# Patient Record
Sex: Male | Born: 1965 | Race: White | Hispanic: No | Marital: Single | Smoking: Never smoker
Health system: Southern US, Community
[De-identification: ages and names within clinical notes are randomized; demographics above are authoritative.]

## PROBLEM LIST (undated history)

## (undated) DIAGNOSIS — R51 Headache: Secondary | ICD-10-CM

## (undated) DIAGNOSIS — R42 Dizziness and giddiness: Secondary | ICD-10-CM

## (undated) DIAGNOSIS — D509 Iron deficiency anemia, unspecified: Secondary | ICD-10-CM

## (undated) DIAGNOSIS — B019 Varicella without complication: Secondary | ICD-10-CM

## (undated) DIAGNOSIS — K649 Unspecified hemorrhoids: Secondary | ICD-10-CM

## (undated) DIAGNOSIS — K635 Polyp of colon: Secondary | ICD-10-CM

## (undated) HISTORY — DX: Varicella without complication: B01.9

## (undated) HISTORY — DX: Iron deficiency anemia, unspecified: D50.9

## (undated) HISTORY — DX: Polyp of colon: K63.5

## (undated) HISTORY — DX: Unspecified hemorrhoids: K64.9

## (undated) HISTORY — PX: HEMORROIDECTOMY: SUR656

## (undated) HISTORY — PX: COLONOSCOPY: SHX174

## (undated) HISTORY — PX: NASAL SEPTUM SURGERY: SHX37

---

## 2008-08-21 ENCOUNTER — Ambulatory Visit: Payer: Self-pay | Admitting: Sports Medicine

## 2008-08-21 DIAGNOSIS — M79609 Pain in unspecified limb: Secondary | ICD-10-CM | POA: Insufficient documentation

## 2008-09-22 ENCOUNTER — Ambulatory Visit: Payer: Self-pay | Admitting: Sports Medicine

## 2009-12-24 ENCOUNTER — Emergency Department (HOSPITAL_COMMUNITY): Admission: EM | Admit: 2009-12-24 | Discharge: 2009-12-24 | Payer: Self-pay | Admitting: Emergency Medicine

## 2009-12-31 ENCOUNTER — Inpatient Hospital Stay (HOSPITAL_COMMUNITY): Admission: EM | Admit: 2009-12-31 | Discharge: 2010-01-05 | Payer: Self-pay | Admitting: Emergency Medicine

## 2010-01-03 ENCOUNTER — Ambulatory Visit: Payer: Self-pay | Admitting: Internal Medicine

## 2010-02-06 DIAGNOSIS — R42 Dizziness and giddiness: Secondary | ICD-10-CM

## 2010-02-06 HISTORY — DX: Dizziness and giddiness: R42

## 2010-04-19 LAB — CARDIAC PANEL(CRET KIN+CKTOT+MB+TROPI)
CK, MB: 0.2 ng/mL — ABNORMAL LOW (ref 0.3–4.0)
Relative Index: INVALID (ref 0.0–2.5)
Relative Index: INVALID (ref 0.0–2.5)
Total CK: 59 U/L (ref 7–232)
Troponin I: 0.02 ng/mL (ref 0.00–0.06)

## 2010-04-19 LAB — DIFFERENTIAL
Basophils Absolute: 0 10*3/uL (ref 0.0–0.1)
Eosinophils Absolute: 0 10*3/uL (ref 0.0–0.7)
Eosinophils Relative: 1 % (ref 0–5)
Lymphocytes Relative: 19 % (ref 12–46)
Monocytes Absolute: 0.4 10*3/uL (ref 0.1–1.0)

## 2010-04-19 LAB — TYPE AND SCREEN
Antibody Screen: NEGATIVE
Unit division: 0

## 2010-04-19 LAB — CBC
HCT: 24.4 % — ABNORMAL LOW (ref 39.0–52.0)
HCT: 25.1 % — ABNORMAL LOW (ref 39.0–52.0)
HCT: 33.6 % — ABNORMAL LOW (ref 39.0–52.0)
Hemoglobin: 5.7 g/dL — CL (ref 13.0–17.0)
Hemoglobin: 7.9 g/dL — ABNORMAL LOW (ref 13.0–17.0)
Hemoglobin: 8.2 g/dL — ABNORMAL LOW (ref 13.0–17.0)
MCH: 29 pg (ref 26.0–34.0)
MCH: 29.2 pg (ref 26.0–34.0)
MCH: 29.5 pg (ref 26.0–34.0)
MCH: 30 pg (ref 26.0–34.0)
MCHC: 33.5 g/dL (ref 30.0–36.0)
MCHC: 33.6 g/dL (ref 30.0–36.0)
MCHC: 33.6 g/dL (ref 30.0–36.0)
MCV: 86.4 fL (ref 78.0–100.0)
MCV: 87.8 fL (ref 78.0–100.0)
MCV: 88.1 fL (ref 78.0–100.0)
Platelets: 314 10*3/uL (ref 150–400)
RBC: 1.95 MIL/uL — ABNORMAL LOW (ref 4.22–5.81)
RBC: 2.63 MIL/uL — ABNORMAL LOW (ref 4.22–5.81)
RDW: 13.1 % (ref 11.5–15.5)
RDW: 13.4 % (ref 11.5–15.5)
RDW: 14.3 % (ref 11.5–15.5)
WBC: 3.5 10*3/uL — ABNORMAL LOW (ref 4.0–10.5)

## 2010-04-19 LAB — BASIC METABOLIC PANEL
BUN: 3 mg/dL — ABNORMAL LOW (ref 6–23)
CO2: 23 mEq/L (ref 19–32)
Calcium: 7.2 mg/dL — ABNORMAL LOW (ref 8.4–10.5)
Calcium: 8.3 mg/dL — ABNORMAL LOW (ref 8.4–10.5)
Chloride: 109 mEq/L (ref 96–112)
Chloride: 113 mEq/L — ABNORMAL HIGH (ref 96–112)
GFR calc Af Amer: >60 mL/min (ref >60)
GFR calc non Af Amer: >60 mL/min (ref >60)
Glucose, Bld: 93 mg/dL (ref 70–99)
Glucose, Bld: 97 mg/dL (ref 70–99)
Potassium: 3.3 mEq/L — ABNORMAL LOW (ref 3.5–5.1)
Potassium: 3.9 mEq/L (ref 3.5–5.1)
Sodium: 139 mEq/L (ref 135–145)
Sodium: 141 mEq/L (ref 135–145)

## 2010-04-19 LAB — HEMOGLOBIN AND HEMATOCRIT, BLOOD
HCT: 18.6 % — ABNORMAL LOW (ref 39.0–52.0)
HCT: 19.1 % — ABNORMAL LOW (ref 39.0–52.0)
HCT: 24.4 % — ABNORMAL LOW (ref 39.0–52.0)
HCT: 25.9 % — ABNORMAL LOW (ref 39.0–52.0)
HCT: 26.3 % — ABNORMAL LOW (ref 39.0–52.0)
HCT: 26.7 % — ABNORMAL LOW (ref 39.0–52.0)
HCT: 27.1 % — ABNORMAL LOW (ref 39.0–52.0)
HCT: 27.7 % — ABNORMAL LOW (ref 39.0–52.0)
Hemoglobin: 6.3 g/dL — CL (ref 13.0–17.0)
Hemoglobin: 6.3 g/dL — CL (ref 13.0–17.0)
Hemoglobin: 7.9 g/dL — ABNORMAL LOW (ref 13.0–17.0)
Hemoglobin: 8.4 g/dL — ABNORMAL LOW (ref 13.0–17.0)
Hemoglobin: 9.1 g/dL — ABNORMAL LOW (ref 13.0–17.0)
Hemoglobin: 9.1 g/dL — ABNORMAL LOW (ref 13.0–17.0)
Hemoglobin: 9.1 g/dL — ABNORMAL LOW (ref 13.0–17.0)
Hemoglobin: 9.2 g/dL — ABNORMAL LOW (ref 13.0–17.0)
Hemoglobin: 9.6 g/dL — ABNORMAL LOW (ref 13.0–17.0)

## 2010-04-19 LAB — POCT I-STAT, CHEM 8
BUN: <3 mg/dL — ABNORMAL LOW (ref 6–23)
Creatinine, Ser: 1 mg/dL (ref 0.4–1.5)
Glucose, Bld: 149 mg/dL — ABNORMAL HIGH (ref 70–99)
Sodium: 138 mEq/L (ref 135–145)
TCO2: 26 mmol/L (ref 0–100)

## 2010-04-19 LAB — SAMPLE TO BLOOD BANK

## 2010-04-19 LAB — PREPARE RBC (CROSSMATCH)

## 2011-02-07 DIAGNOSIS — R519 Headache, unspecified: Secondary | ICD-10-CM

## 2011-02-07 HISTORY — DX: Headache, unspecified: R51.9

## 2011-05-03 ENCOUNTER — Ambulatory Visit (INDEPENDENT_AMBULATORY_CARE_PROVIDER_SITE_OTHER): Payer: 59 | Admitting: Family Medicine

## 2011-05-03 ENCOUNTER — Encounter: Payer: Self-pay | Admitting: Family Medicine

## 2011-05-03 VITALS — BP 120/88 | HR 80 | Temp 97.7°F | Resp 12 | Ht 71.0 in | Wt 174.0 lb

## 2011-05-03 DIAGNOSIS — R42 Dizziness and giddiness: Secondary | ICD-10-CM

## 2011-05-03 DIAGNOSIS — R930 Abnormal findings on diagnostic imaging of skull and head, not elsewhere classified: Secondary | ICD-10-CM

## 2011-05-03 NOTE — Patient Instructions (Signed)
Consider complete physical by next September.

## 2011-05-03 NOTE — Progress Notes (Signed)
  Subjective:    Patient ID: Jacob Malone, male    DOB: 1965/03/15, 46 y.o.   MRN: [id number]  HPI  Patient here to establish care. Generally excellent health. Couple months ago developed some nonspecific dizziness. Initially thought to have benign positional vertigo. He eventually saw ENT and had VNG study which was normal.  MRI scan showed nonspecific white matter lesions. He has seen neurologist and has been referred to neuro ENT at [facility]. He takes no medications. He denies any diplopia. No blurred vision. No peripheral numbness or weakness. No ataxia. No headaches.  He had previous surgery for hemorrhoidectomy and deviated septum. Family history is unrevealing.  Patient is single. Nonsmoker. Rare alcohol use. Works in [employer]   Review of Systems  Constitutional: Negative for fever, chills, appetite change and unexpected weight change.  HENT: Negative for hearing loss and tinnitus.   Eyes: Negative for visual disturbance.  Respiratory: Negative for cough and shortness of breath.   Cardiovascular: Negative for chest pain.  Gastrointestinal: Negative for abdominal pain.  Genitourinary: Negative for dysuria.  Musculoskeletal: Negative for back pain.  Neurological: Positive for dizziness. Negative for tremors, seizures, syncope, speech difficulty, weakness, numbness and headaches.  Hematological: Negative for adenopathy. Does not bruise/bleed easily.  Psychiatric/Behavioral: Negative for confusion.       Objective:   Physical Exam  Constitutional: He is oriented to person, place, and time. He appears well-developed and well-nourished.  HENT:  Right Ear: External ear normal.  Left Ear: External ear normal.  Mouth/Throat: Oropharynx is clear and moist.  Neck: Neck supple. No thyromegaly present.  Cardiovascular: Normal rate and regular rhythm.   No murmur heard. Pulmonary/Chest: Effort normal and breath sounds normal. No respiratory distress. He has no wheezes. He  has no rales.  Musculoskeletal: He exhibits no edema.  Lymphadenopathy:    He has no cervical adenopathy.  Neurological: He is alert and oriented to person, place, and time. He has normal reflexes. No cranial nerve deficit.       No focal strength deficits  Psychiatric: He has a normal mood and affect. His behavior is normal.          Assessment & Plan:  Vague dizziness. Nonspecific white matter lesions on MRI scan. Neurologic workup in process. Pt will schedule CPE around September of this year.

## 2011-05-23 DIAGNOSIS — IMO0001 Reserved for inherently not codable concepts without codable children: Secondary | ICD-10-CM | POA: Insufficient documentation

## 2011-05-23 DIAGNOSIS — G43109 Migraine with aura, not intractable, without status migrainosus: Secondary | ICD-10-CM | POA: Insufficient documentation

## 2011-05-23 DIAGNOSIS — K219 Gastro-esophageal reflux disease without esophagitis: Secondary | ICD-10-CM

## 2011-05-31 IMAGING — CR DG ABDOMEN ACUTE W/ 1V CHEST
3 series · 3 of 3 positions shown · non-contrast
Comparison: None.

CLINICAL DATA: Rectal bleeding.

ACUTE ABDOMEN SERIES (ABDOMEN 2 VIEW & CHEST 1 VIEW)

[w chest pa]
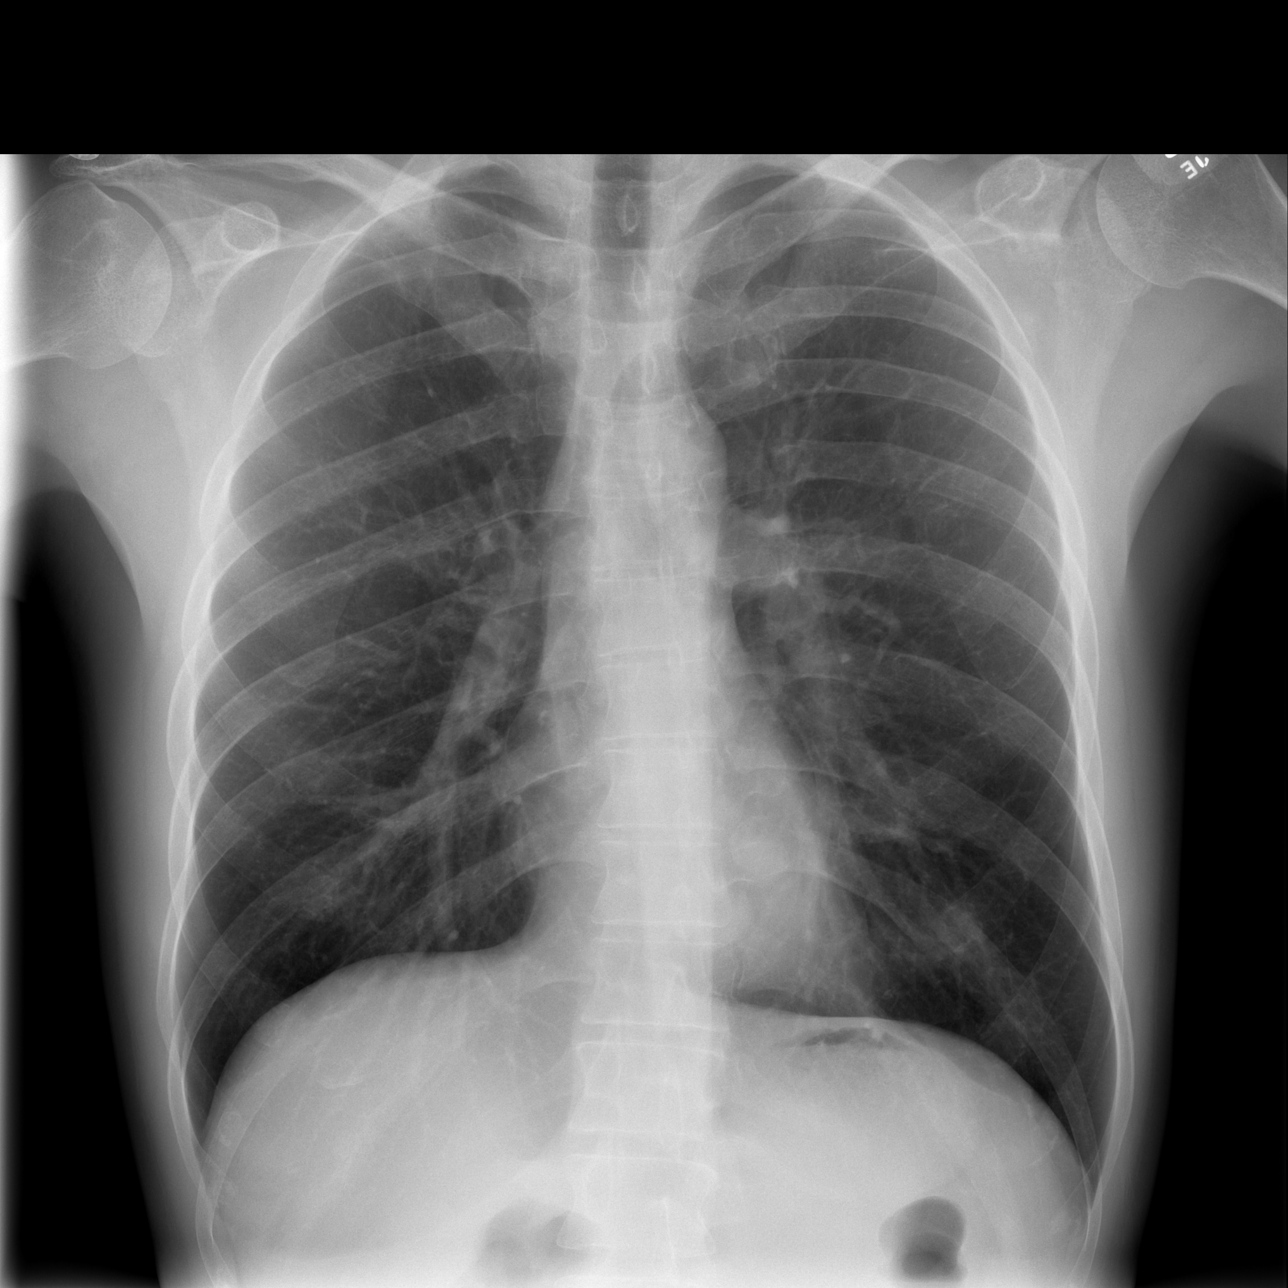

[w abdomen upright]
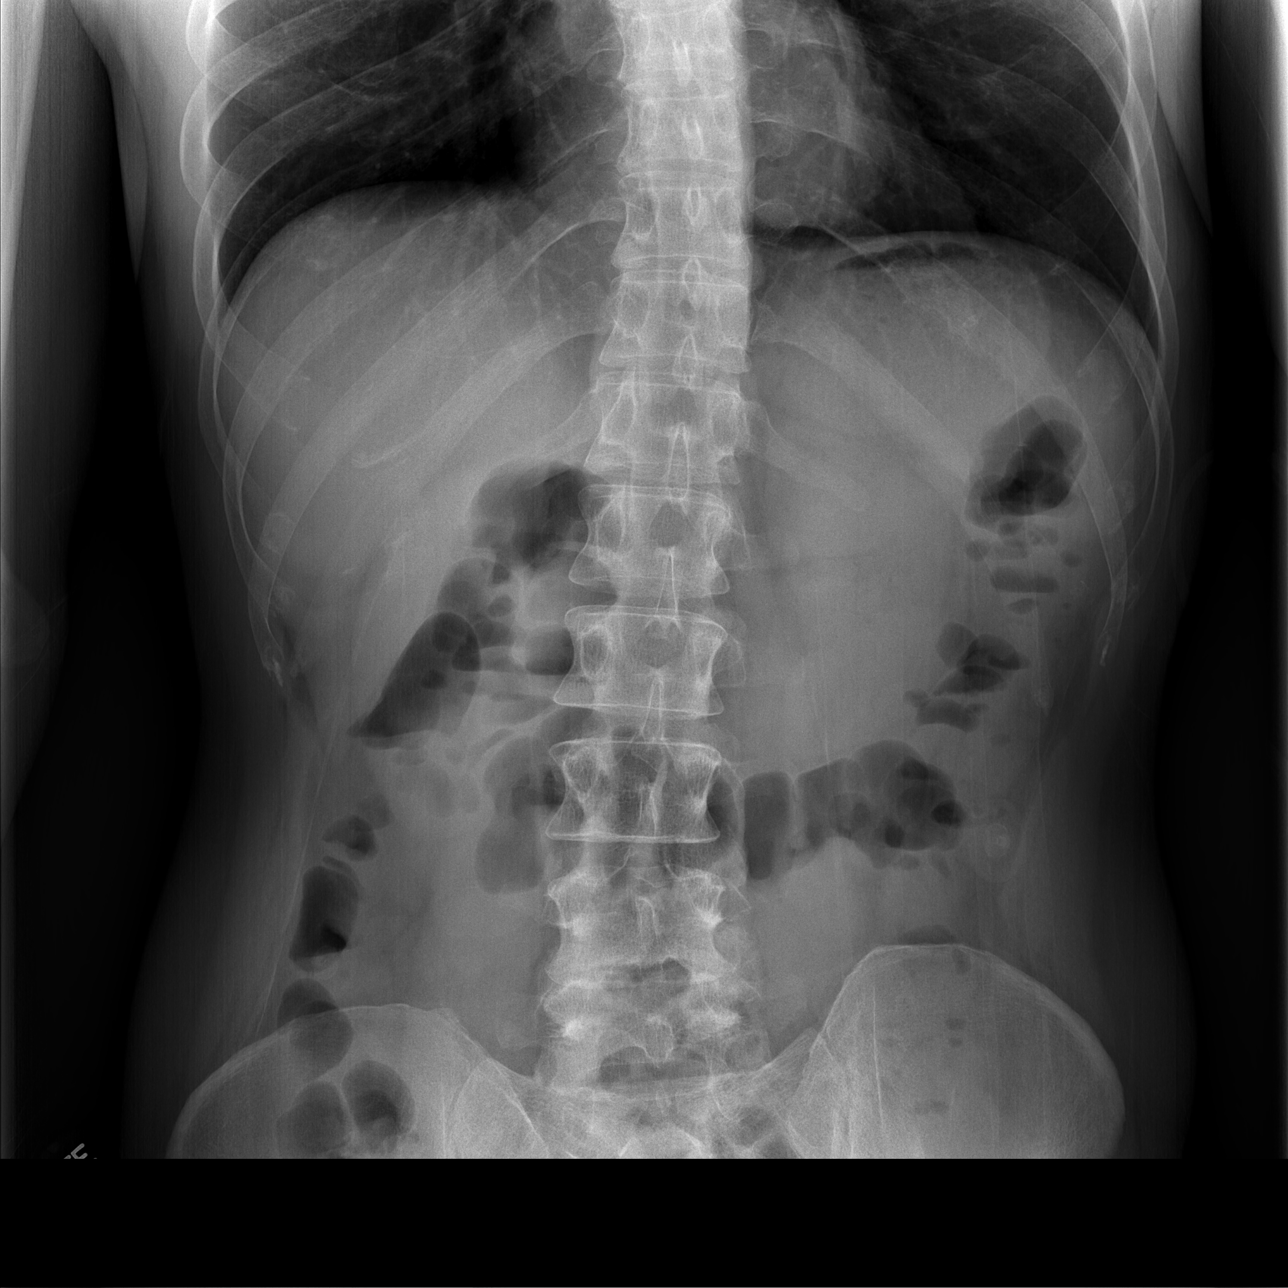

[t abdomen supine]
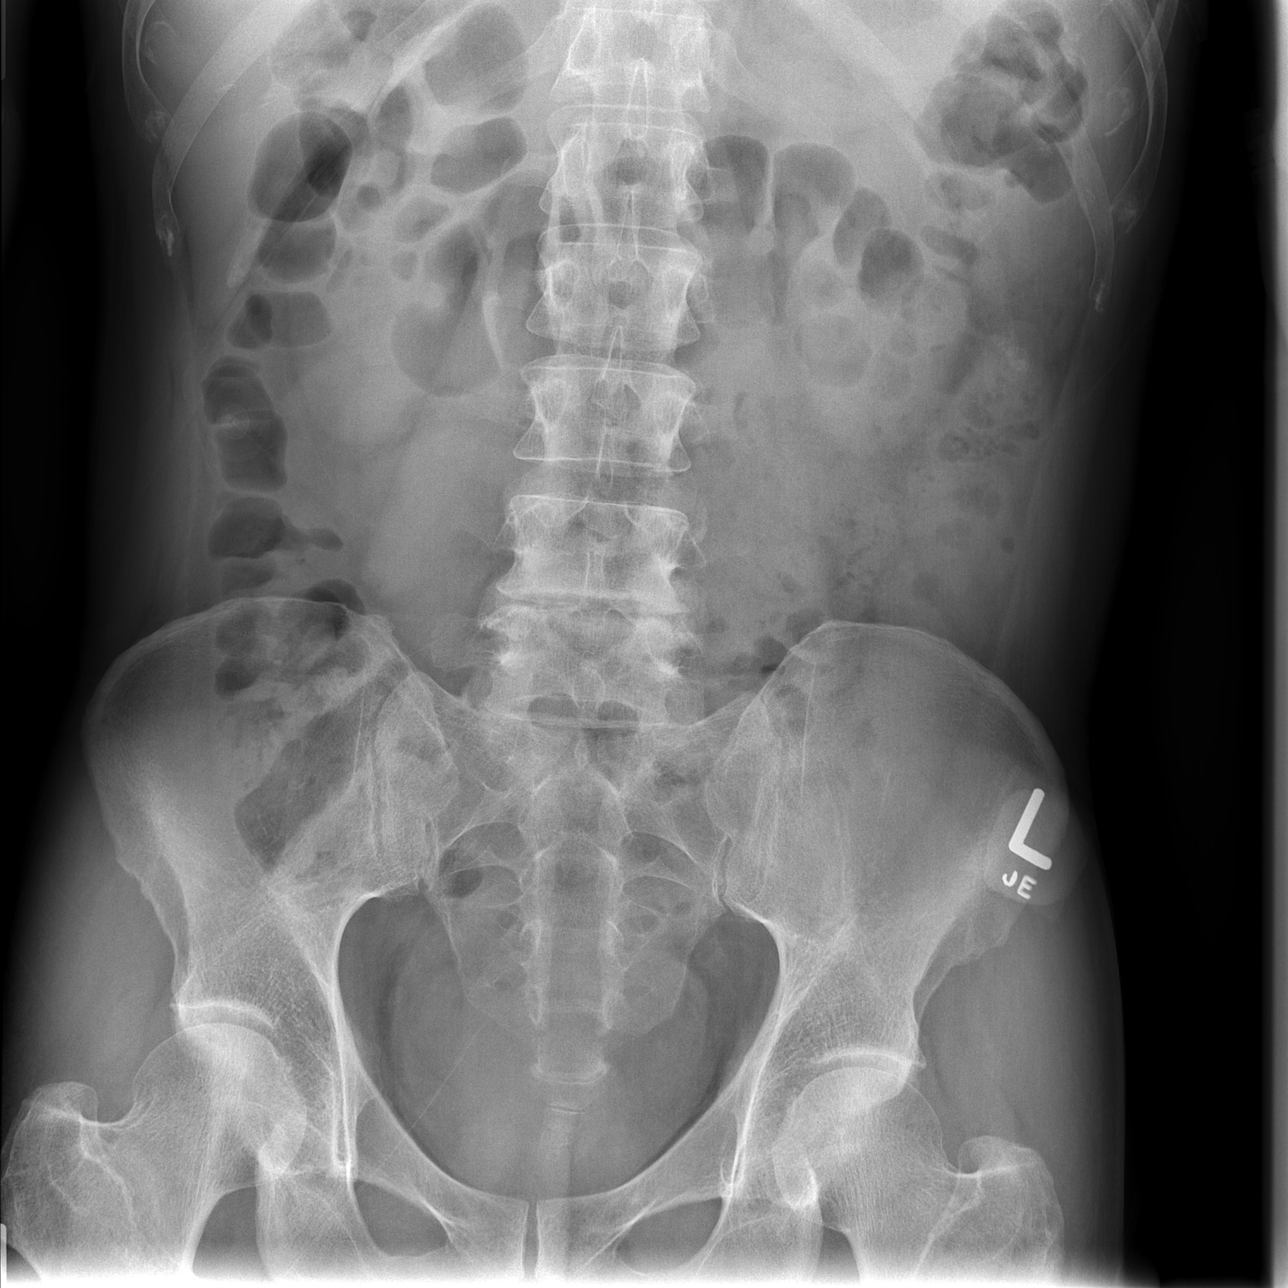

[3 of 3 positions shown; findings below may reference images not displayed]

FINDINGS: Gas throughout nondistended colon.  No small bowel
dilatation.  No free air.  No organomegaly or suspicious
calcification.

Heart and mediastinal contours are within normal limits.  No focal
opacities or effusions.  No acute bony abnormality.
IMPRESSION: No obstruction or free air.

No active cardiopulmonary disease.

## 2011-06-30 ENCOUNTER — Ambulatory Visit (INDEPENDENT_AMBULATORY_CARE_PROVIDER_SITE_OTHER): Payer: 59 | Admitting: Family Medicine

## 2011-06-30 VITALS — BP 116/82 | HR 64 | Temp 98.2°F | Wt 168.0 lb

## 2011-06-30 DIAGNOSIS — G971 Other reaction to spinal and lumbar puncture: Secondary | ICD-10-CM

## 2011-06-30 NOTE — Patient Instructions (Addendum)
Follow up for any persistent headache into next week. Follow up sooner for any fever, stiff neck, confusion, vomiting, worsening headache, or any new neurologic deficits.  Spinal Headache, Conservative Treatment Sometimes following a spinal tap (lumbar puncture) or an epidural, there may be CSF (cerebrospinal fluid) leakage through a hole in the dura. The dura is one of the protective membranes covering the brain and spinal cord. This leakage produces low CSF pressure. Pressure on the pain sensitive structures in the brain and relaxation (dilating) of the vessels in the head when the patient is upright is thought to cause headaches. The headache usually lasts until the hole heals and CSF pressure is restored. This can last a few days and rarely more than one week. THERAPEUTIC ALTERNATIVES TO EPIDURAL BLOOD PATCHING INCLUDE:  Bedrest: The symptoms of PDPH are lessened by lying down on your back. Lying on your back for a period of time (such as 24 hours) after a dural puncture has no preventative effect. It only delays the start of the PDPH.   Hydration: Normal taking in fluids (hydration) should be maintained. Extra hydration does not alleviate the headache, but dehydration may make symptoms worse.   Analgesics: Narcotic analgesics and, in some instances, non-steroidal anti-inflammatory agents are often given for treatment of the headache pain.   Caffeine: Caffeine intake is a therapy to help shrink the cerebral vessels. Patients should have caffeine early in the day so that he/she can sleep at night. 500 mg of Caffeine sodium benzoate can be given in the vein (intravenously). It can be given once two hours later if the first dose does not have the desired effect. Caffeinated beverages (colas, tea, coffee) can be somewhat effective also.   Epidural Saline Injection: Large-volume shots (boluses) or infusion of epidural normal saline can help to quickly and temporarily increase the epidural pressure. The  infusions slow the speed at which CSF leaks through the dural hole. This may speed the natural healing process. Although epidural saline can be a useful technique, epidural blood patches often have a higher success rate.  SEEK IMMEDIATE MEDICAL ATTENTION IF:   You do not get relief from the medications given to you or your pain becomes severe.   You have an unexplained oral temperature above 102 F (38.9 C), or as your caregiver suggests.   You have a stiff neck.   You lose bowel or bladder control.   You develop severe symptoms different from your first symptoms.   You have trouble walking.  Document Released: 07/15/2001 Document Revised: 01/12/2011 Document Reviewed: 05/18/2008 [facility] Patient Information 2012 [place], [place].

## 2011-06-30 NOTE — Progress Notes (Signed)
  Subjective:    Patient ID: Jacob Malone, male    DOB: [birth year], 46 y.o.   MRN: [id number]  HPI  Headache. Patient's had some vague neurologic symptoms as had extensive workup recently. Refer to prior note. Question of demyelinating lesions from MRI scan and he had lumbar puncture on 06/22/2011-as part of further evaluation. Following lumbar pain puncture patient developed headache. This was fairly intense and he went back on 06/28/2011 and had epidural blood patch. Headache is improved greatly since then but he still has headache when he sits upright or stands and relieved with rest. Currently 4-5/10 severity and promptly improved with supine position. He denies any fever, stiff neck, photosensitivity, or any focal neurologic symptoms. His headaches tend to be retro-orbital and in somewhat generalized. Throbbing quality at times. No nausea or vomiting. No fever. Overall headaches are greatly improving.   Review of Systems  Constitutional: Negative for fever, chills and appetite change.  Eyes: Negative for visual disturbance.  Respiratory: Negative for cough and shortness of breath.   Cardiovascular: Negative for chest pain.  Gastrointestinal: Negative for nausea and vomiting.  Neurological: Positive for headaches. Negative for seizures, syncope and weakness.       Objective:   Physical Exam  Constitutional: He is oriented to person, place, and time. He appears well-developed and well-nourished.  Neck: Neck supple.  Cardiovascular: Normal rate and regular rhythm.   Pulmonary/Chest: Effort normal and breath sounds normal. No respiratory distress. He has no wheezes. He has no rales.  Musculoskeletal: He exhibits no edema.  Neurological: He is alert and oriented to person, place, and time. He has normal reflexes. No cranial nerve deficit. Coordination normal.  Psychiatric: He has a normal mood and affect. His behavior is normal.          Assessment & Plan:  Post lumbar puncture  headache. Improving following epidural blood patch. At this point would recommend observation. Followup immediately if he has any worsening headache, vomiting, stiff neck, fever, confusion, or any focal neurologic concerns.

## 2011-07-03 ENCOUNTER — Encounter: Payer: Self-pay | Admitting: Family Medicine

## 2011-07-12 ENCOUNTER — Ambulatory Visit (INDEPENDENT_AMBULATORY_CARE_PROVIDER_SITE_OTHER): Payer: 59 | Admitting: Family Medicine

## 2011-07-12 ENCOUNTER — Encounter: Payer: Self-pay | Admitting: Family Medicine

## 2011-07-12 VITALS — BP 110/80 | Temp 97.7°F | Wt 168.0 lb

## 2011-07-12 DIAGNOSIS — G971 Other reaction to spinal and lumbar puncture: Secondary | ICD-10-CM

## 2011-07-12 NOTE — Progress Notes (Signed)
  Subjective:    Patient ID: Jacob Malone, male    DOB: 1965/03/14, 46 y.o.   MRN: [id number]  HPI  Patient here with still some ongoing issues regarding recent spinal tap headache. Overall headache is improved following blood patch. Pain intensity is 2/10. Headache is worse after prolonged periods of sitting or standing and relieved supine. He has followup with anesthesiologist tomorrow. Saw  neurologist who recommended consideration for repeat blood patch. He prescribed Medrol Dosepak per neurologist which pt did not fill. He denies any focal neurologic symptoms such as ataxia, dysphagia, or any speech changes. No focal weakness. No fever or chills.   Review of Systems  Constitutional: Negative for appetite change and unexpected weight change.  Neurological: Positive for headaches. Negative for dizziness, seizures, syncope and weakness.  Psychiatric/Behavioral: Negative for confusion.       Objective:   Physical Exam  Constitutional: He is oriented to person, place, and time. He appears well-developed and well-nourished.  HENT:  Head: Normocephalic and atraumatic.  Eyes: Pupils are equal, round, and reactive to light.  Neck: Neck supple.  Cardiovascular: Normal rate and regular rhythm.   Pulmonary/Chest: Effort normal and breath sounds normal. No respiratory distress. He has no wheezes. He has no rales.  Neurological: He is alert and oriented to person, place, and time. He has normal reflexes. No cranial nerve deficit.       No ataxia. Normal gait. Normal finger to nose testing.          Assessment & Plan:  Post lumbar puncture headache. Overall improved. He is encouraged to followup with anesthesiologist tomorrow. He is being considered for repeat blood patch. We discussed possible medical therapies including low-dose gabapentin but at this point he wishes to wait. Neurologic exam is nonfocal at this time.

## 2011-07-13 ENCOUNTER — Telehealth: Payer: Self-pay | Admitting: *Deleted

## 2011-07-13 NOTE — Telephone Encounter (Signed)
I am not aware of neurologist who specifically deals with post lumbar puncture headache but would try [facility]

## 2011-07-13 NOTE — Telephone Encounter (Signed)
Pt called to report his visit with Dr Judeth Cornfield today, she did not recommend another blood patch.  She did recommend referral to a neurologist.   She did not have one in mind, so pt is asking for advice for a neurologist who will work with lumbar puncture headaches. [phone]

## 2011-07-14 NOTE — Telephone Encounter (Signed)
Pt informed on VM, phone number given to [facility]

## 2011-07-28 ENCOUNTER — Encounter: Payer: Self-pay | Admitting: Family Medicine

## 2011-07-28 ENCOUNTER — Ambulatory Visit (INDEPENDENT_AMBULATORY_CARE_PROVIDER_SITE_OTHER): Payer: 59 | Admitting: Family Medicine

## 2011-07-28 ENCOUNTER — Telehealth: Payer: Self-pay | Admitting: Family Medicine

## 2011-07-28 VITALS — BP 100/62 | Temp 98.0°F | Wt 167.0 lb

## 2011-07-28 DIAGNOSIS — G971 Other reaction to spinal and lumbar puncture: Secondary | ICD-10-CM

## 2011-07-28 NOTE — Telephone Encounter (Signed)
  Caller: Jacob Malone/Patient; PCP: Evelena Peat; CB#: [phone];  Call regarding Having Headache Issues Again. Started 07/25/11 and now H/A is constant and neck and back pain is worse.  Tried  Ibuprofen, but does not help.  Triaged H/A and all emergent SX R/O.  Disp = needs to be seen in 4 hrs for H/A unrelieved with home care.  Pt  requesting appt with Dr. Caryl Never.  No appts available in the next 4 hrs. Inst ofc will call him back.

## 2011-07-28 NOTE — Progress Notes (Signed)
  Subjective:    Patient ID: Jacob Malone, male    DOB: 1965/10/28, 46 y.o.   MRN: [id number]  Headache  Pertinent negatives include no coughing, fever, nausea, seizures, vomiting or weakness.    Reviewed note from 06/30/11:  Headache. Patient's had some vague neurologic symptoms and had extensive workup recently. Refer to prior note. Question of demyelinating lesions from MRI scan and he had lumbar puncture on 06/22/2011-as part of further evaluation. Following lumbar pain puncture patient developed headache. This was fairly intense and he went back on 06/28/2011 and had epidural blood patch. Headache is improved greatly since then but he still has headache when he sits upright or stands and relieved with rest. Currently 4-5/10 severity and promptly improved with supine position. He denies any fever, stiff neck, photosensitivity, or any focal neurologic symptoms. His headaches tend to be retro-orbital and in somewhat generalized. Throbbing quality at times. No nausea or vomiting. No fever. Overall headaches are greatly improving.  Headache is not as intense but has resumed and is daily and relatively constant. Dull quality. Mostly occipital and somewhat generalized. He notices a "pressure sensation" when sitting up but headache is not relieved with lying flat but he feels less pressure when lying supine. Repeat blood patch was not recommended by anesthesiologist. He has been scheduled to see neurologist but cannot get in until end of July. Severity of headache as 4/10. No nausea or vomiting. No fever. No focal weakness.  Past Medical History  Diagnosis Date  . Chicken pox    Past Surgical History  Procedure Date  . Hemorroidectomy 2011, 2012  . Nasal septum surgery     reports that he has never smoked. He does not have any smokeless tobacco history on file. His alcohol and drug histories not on file. family history is not on file. Allergies  Allergen Reactions  . Lansoprazole     "Did not  feel right"      Review of Systems  Constitutional: Negative for fever, chills and appetite change.  Eyes: Negative for visual disturbance.  Respiratory: Negative for cough and shortness of breath.   Cardiovascular: Negative for chest pain.  Gastrointestinal: Negative for nausea and vomiting.  Neurological: Positive for headaches. Negative for seizures, syncope and weakness.       Objective:   Physical Exam  Constitutional: He is oriented to person, place, and time. He appears well-developed and well-nourished.  Eyes: Pupils are equal, round, and reactive to light.  Neck: Neck supple.  Cardiovascular: Normal rate and regular rhythm.   Pulmonary/Chest: Effort normal and breath sounds normal. No respiratory distress. He has no wheezes. He has no rales.  Musculoskeletal: He exhibits no edema.  Neurological: He is alert and oriented to person, place, and time. He has normal reflexes. No cranial nerve deficit. Coordination normal.  Psychiatric: He has a normal mood and affect. His behavior is normal.          Assessment & Plan:  Post lumbar puncture headache. Relatively mild and stable but persistent. Patient's had previous blood patching as above. We discussed other possible options such as gabapentin but at this point he wishes to wait. His preference is to get another neurology opinion and will try to set up.

## 2011-07-28 NOTE — Telephone Encounter (Signed)
Appt made today for Dr. Caryl Never at 3:45

## 2011-07-30 ENCOUNTER — Encounter: Payer: Self-pay | Admitting: Family Medicine

## 2011-08-09 ENCOUNTER — Telehealth: Payer: Self-pay | Admitting: Family Medicine

## 2011-08-09 MED ORDER — ACETAZOLAMIDE 250 MG PO TABS: ORAL_TABLET | ORAL | Status: DC

## 2011-08-09 NOTE — Telephone Encounter (Signed)
Pt called to check on status getting Diamox. Pls call pt today. [facility].

## 2011-08-09 NOTE — Telephone Encounter (Signed)
I spoke with Dr. Sonia Baller at [facility] who specializes in this type of headache. She suggested considering Diamox 250 mg one half to one tablet every 6 hours as needed for headache.  If he is interested, dispense 60 tablets with no refill.  Make sure he has no sulfa allergy.

## 2011-08-09 NOTE — Telephone Encounter (Signed)
Patient called to check status of the recommended rx from his [facility] to his PCP. Please advise.

## 2011-08-09 NOTE — Telephone Encounter (Signed)
Pt informed. Rx sent to pharmacy  

## 2011-08-11 ENCOUNTER — Other Ambulatory Visit: Payer: Self-pay | Admitting: Neurology

## 2011-10-20 DIAGNOSIS — M25519 Pain in unspecified shoulder: Secondary | ICD-10-CM | POA: Insufficient documentation

## 2011-10-20 DIAGNOSIS — S43439A Superior glenoid labrum lesion of unspecified shoulder, initial encounter: Secondary | ICD-10-CM | POA: Insufficient documentation

## 2011-11-02 ENCOUNTER — Ambulatory Visit: Payer: 59 | Attending: Orthopedic Surgery | Admitting: Physical Therapy

## 2011-11-02 DIAGNOSIS — M25519 Pain in unspecified shoulder: Secondary | ICD-10-CM | POA: Insufficient documentation

## 2011-11-02 DIAGNOSIS — M25619 Stiffness of unspecified shoulder, not elsewhere classified: Secondary | ICD-10-CM | POA: Insufficient documentation

## 2011-11-02 DIAGNOSIS — IMO0001 Reserved for inherently not codable concepts without codable children: Secondary | ICD-10-CM | POA: Insufficient documentation

## 2011-11-23 ENCOUNTER — Ambulatory Visit: Payer: 59 | Attending: Orthopedic Surgery | Admitting: Physical Therapy

## 2011-11-23 DIAGNOSIS — M25619 Stiffness of unspecified shoulder, not elsewhere classified: Secondary | ICD-10-CM | POA: Insufficient documentation

## 2011-11-23 DIAGNOSIS — M25519 Pain in unspecified shoulder: Secondary | ICD-10-CM | POA: Insufficient documentation

## 2011-11-23 DIAGNOSIS — IMO0001 Reserved for inherently not codable concepts without codable children: Secondary | ICD-10-CM | POA: Insufficient documentation

## 2011-11-30 ENCOUNTER — Ambulatory Visit: Payer: 59 | Admitting: Physical Therapy

## 2011-12-07 ENCOUNTER — Encounter: Payer: 59 | Admitting: Physical Therapy

## 2011-12-14 ENCOUNTER — Ambulatory Visit: Payer: 59 | Admitting: Physical Therapy

## 2011-12-18 DIAGNOSIS — M7501 Adhesive capsulitis of right shoulder: Secondary | ICD-10-CM | POA: Insufficient documentation

## 2012-02-22 ENCOUNTER — Encounter: Payer: Self-pay | Admitting: Family Medicine

## 2012-02-22 ENCOUNTER — Ambulatory Visit (INDEPENDENT_AMBULATORY_CARE_PROVIDER_SITE_OTHER): Payer: 59 | Admitting: Family Medicine

## 2012-02-22 VITALS — BP 118/82 | Temp 98.0°F | Wt 174.0 lb

## 2012-02-22 DIAGNOSIS — H9209 Otalgia, unspecified ear: Secondary | ICD-10-CM

## 2012-02-22 DIAGNOSIS — H9202 Otalgia, left ear: Secondary | ICD-10-CM

## 2012-02-22 NOTE — Progress Notes (Signed)
  Subjective:    Patient ID: Jacob Malone, male    DOB: 24-Jun-1965, 47 y.o.   MRN: [id number]  HPI Acute visit.  L ear ache.  Dull.   Onset 5 days ago. No nasal congestion.  No ear drainage. No fever. No vertigo  .  Severity 5/10. No sore throat No exacerbating.  NO alleviating.  No TMJ pain.   No rashes.  No tinnitus.     Review of Systems  Constitutional: Negative for fever and chills.  HENT: Positive for ear pain. Negative for hearing loss, congestion, sinus pressure, tinnitus and ear discharge.   Neurological: Positive for dizziness. Negative for weakness. 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  Hematological: Negative for adenopathy.       Objective:   Physical Exam  Constitutional: He is oriented to person, place, and time. He appears well-developed and well-nourished.  HENT:  Right Ear: External ear normal.  Left Ear: External ear normal.  Mouth/Throat: Oropharynx is clear and moist.       No TMJ tenderness  Neck: Neck supple.  Cardiovascular: Normal rate and regular rhythm.   Pulmonary/Chest: Effort normal and breath sounds normal. No respiratory distress. He has no wheezes. He has no rales.  Lymphadenopathy:    He has no cervical adenopathy.  Neurological: He is alert and oriented to person, place, and time. No cranial nerve deficit.  Skin: No rash noted.          Assessment & Plan:  Otalgia. Normal exam. Reassurance and follow for now. Consider followup with his ENT if symptoms persist

## 2012-02-22 NOTE — Patient Instructions (Addendum)
Follow up for any fever, hearing changes, or any persistent pain.

## 2012-11-25 ENCOUNTER — Ambulatory Visit (INDEPENDENT_AMBULATORY_CARE_PROVIDER_SITE_OTHER): Payer: 59 | Admitting: Family Medicine

## 2012-11-25 ENCOUNTER — Encounter: Payer: Self-pay | Admitting: Family Medicine

## 2012-11-25 VITALS — BP 116/76 | HR 83 | Temp 97.9°F | Wt 174.0 lb

## 2012-11-25 DIAGNOSIS — R208 Other disturbances of skin sensation: Secondary | ICD-10-CM

## 2012-11-25 DIAGNOSIS — R5383 Other fatigue: Secondary | ICD-10-CM

## 2012-11-25 DIAGNOSIS — R209 Unspecified disturbances of skin sensation: Secondary | ICD-10-CM

## 2012-11-25 DIAGNOSIS — R5381 Other malaise: Secondary | ICD-10-CM

## 2012-11-25 LAB — CBC WITH DIFFERENTIAL/PLATELET
Basophils Relative: 0.5 % (ref 0.0–3.0)
Eosinophils Relative: 0.6 % (ref 0.0–5.0)
Monocytes Relative: 10.4 % (ref 3.0–12.0)
Neutrophils Relative %: 50.6 % (ref 43.0–77.0)
Platelets: 259 10*3/uL (ref 150.0–400.0)
RBC: 4.71 Mil/uL (ref 4.22–5.81)
WBC: 3.7 10*3/uL — ABNORMAL LOW (ref 4.5–10.5)

## 2012-11-25 LAB — VITAMIN B12: Vitamin B-12: 233 pg/mL (ref 211–911)

## 2012-11-25 LAB — TSH: TSH: 1.51 u[IU]/mL (ref 0.35–5.50)

## 2012-11-25 LAB — BASIC METABOLIC PANEL
BUN: 10 mg/dL (ref 6–23)
Chloride: 102 mEq/L (ref 96–112)
Creatinine, Ser: 1.1 mg/dL (ref 0.4–1.5)
GFR: 80.33 mL/min (ref >60.00)
Potassium: 4 mEq/L (ref 3.5–5.1)

## 2012-11-25 NOTE — Patient Instructions (Signed)
Please notify me if your body temp gets below 95 F.

## 2012-11-25 NOTE — Progress Notes (Signed)
  Subjective:    Patient ID: Jacob Malone, male    DOB: Jul 21, 1965, 47 y.o.   MRN: [id number]  HPI Patient seen complaint" low body temperature". His temperature was actually 96.6 on one occasion and 95.6 on another. He checked this because of concerns of increased fatigue. He has not had any chills or noted fever. He denies any recent sore throat, cough, nasal congestion. He denies any constipation. No alopecia. No other overt symptoms of hypothyroidism. He had some mild weight gain during the past year. Generally sleeping well. No depression issues.  He complains of occasional dysesthesias which are very transient involving both feet. No specific risk factors for B12 deficiency. No history of peripheral neuropathy.  Past Medical History  Diagnosis Date  . Chicken pox    Past Surgical History  Procedure Laterality Date  . Hemorroidectomy  2011, 2012  . Nasal septum surgery      reports that he has never smoked. He does not have any smokeless tobacco history on file. His alcohol and drug histories are not on file. family history is not on file. Allergies  Allergen Reactions  . Lansoprazole     "Did not feel right"      Review of Systems  Constitutional: Positive for fatigue. Negative for fever, chills, appetite change and unexpected weight change.  HENT: Negative for sore throat.   Respiratory: Negative for cough and shortness of breath.   Cardiovascular: Negative for chest pain.  Gastrointestinal: Negative for abdominal pain.  Neurological: Negative for dizziness.  Hematological: Negative for adenopathy.       Objective:   Physical Exam  Constitutional: He is oriented to person, place, and time. He appears well-developed and well-nourished. No distress.  HENT:  Right Ear: External ear normal.  Left Ear: External ear normal.  Mouth/Throat: Oropharynx is clear and moist.  Neck: Neck supple. No thyromegaly present.  Cardiovascular: Normal rate and regular rhythm.    Pulmonary/Chest: Effort normal and breath sounds normal. No respiratory distress. He has no wheezes. He has no rales.  Lymphadenopathy:    He has no cervical adenopathy.  Neurological: He is alert and oriented to person, place, and time. He has normal reflexes. No cranial nerve deficit. Coordination normal.  Skin: No rash noted.          Assessment & Plan:  Patient seen with concerns for low body temperature. He does not have true hypothermia as his lowest recorded body temperature by home reading was 95.6. By our thermometer today 97.9 which we explained is normal (by his monitor today 97.0). He is having some fatigue issues. We'll check CBC, TSH, basic metabolic panel. He also complained of some occasional dysesthesias involving both feet. Check B12 level.

## 2013-04-11 ENCOUNTER — Encounter: Payer: Self-pay | Admitting: Family Medicine

## 2013-04-11 ENCOUNTER — Ambulatory Visit (INDEPENDENT_AMBULATORY_CARE_PROVIDER_SITE_OTHER): Payer: BC Managed Care – PPO | Admitting: Family Medicine

## 2013-04-11 VITALS — BP 110/70 | Temp 97.5°F | Wt 182.0 lb

## 2013-04-11 DIAGNOSIS — M79609 Pain in unspecified limb: Secondary | ICD-10-CM

## 2013-04-11 DIAGNOSIS — M79605 Pain in left leg: Principal | ICD-10-CM

## 2013-04-11 DIAGNOSIS — M79604 Pain in right leg: Secondary | ICD-10-CM

## 2013-04-11 MED ORDER — MELOXICAM 15 MG PO TABS: 15.0000 mg | ORAL_TABLET | Freq: Every day | ORAL | Status: DC

## 2013-04-11 NOTE — Progress Notes (Signed)
   Subjective:    Patient ID: Jacob Malone, male    DOB: Jul 28, 1965, 48 y.o.   MRN: [id number]  Leg Pain  Pertinent negatives include no numbness.   Patient seen with bilateral nondescript leg pain. 2-3 months history. Right lateral distal thigh worse with standing but not necessarily with walking. No rest pain. No back pain. No weakness. He complains of some left proximal calf pain. No swelling. No ecchymosis. No history of injury. He takes occasional ibuprofen without much relief. Also complains of generalized fatigue. Schedule for complete physical in 2 months. No history of shin splints. Currently not exercising. Appetite and weight are stable  Past Medical History  Diagnosis Date  . Chicken pox    Past Surgical History  Procedure Laterality Date  . Hemorroidectomy  2011, 2012  . Nasal septum surgery      reports that he has never smoked. He does not have any smokeless tobacco history on file. His alcohol and drug histories are not on file. family history is not on file. Allergies  Allergen Reactions  . Lansoprazole     "Did not feel right"      Review of Systems  Constitutional: Negative for fever, chills, appetite change and unexpected weight change.  Endocrine: Negative for polydipsia and polyuria.  Neurological: Negative for weakness and numbness.  Hematological: Negative for adenopathy.       Objective:   Physical Exam  Constitutional: He appears well-developed and well-nourished.  Cardiovascular: Normal rate and regular rhythm.   Pulmonary/Chest: Effort normal and breath sounds normal. No respiratory distress. He has no wheezes. He has no rales.  Musculoskeletal: He exhibits no edema.  Legs  reveal. No erythema. Good distal pulses  Neurological:  Strength is full lower extremities. No muscle atrophy. Symmetric reflexes          Assessment & Plan:  Bilateral leg pain. Recent right lateral distal thigh pain not present today and not reproduced on exam. He  has some poorly localized left calf pain. Nonfocal exam. Meloxicam 15 mg once daily as needed. Touch base 2 weeks if symptoms persist

## 2013-04-11 NOTE — Progress Notes (Signed)
Pre visit review using our clinic review tool, if applicable. No additional management support is needed unless otherwise documented below in the visit note. 

## 2013-04-17 ENCOUNTER — Other Ambulatory Visit (INDEPENDENT_AMBULATORY_CARE_PROVIDER_SITE_OTHER): Payer: BC Managed Care – PPO

## 2013-04-17 DIAGNOSIS — Z0000 Encounter for general adult medical examination without abnormal findings: Secondary | ICD-10-CM

## 2013-04-17 LAB — POCT URINALYSIS DIPSTICK
Bilirubin, UA: NEGATIVE
Glucose, UA: NEGATIVE
Ketones, UA: NEGATIVE
LEUKOCYTES UA: NEGATIVE
Nitrite, UA: NEGATIVE
PH UA: 5.5
PROTEIN UA: NEGATIVE
RBC UA: NEGATIVE
Spec Grav, UA: <=1.005
Urobilinogen, UA: 0.2

## 2013-04-17 LAB — CBC WITH DIFFERENTIAL/PLATELET
Basophils Absolute: 0 10*3/uL (ref 0.0–0.1)
Basophils Relative: 0.5 % (ref 0.0–3.0)
EOS PCT: 1.2 % (ref 0.0–5.0)
Eosinophils Absolute: 0.1 10*3/uL (ref 0.0–0.7)
HCT: 41.9 % (ref 39.0–52.0)
HEMOGLOBIN: 14.3 g/dL (ref 13.0–17.0)
LYMPHS ABS: 2.1 10*3/uL (ref 0.7–4.0)
Lymphocytes Relative: 44.1 % (ref 12.0–46.0)
MCHC: 34.1 g/dL (ref 30.0–36.0)
MCV: 89.9 fl (ref 78.0–100.0)
MONO ABS: 0.5 10*3/uL (ref 0.1–1.0)
Monocytes Relative: 11.1 % (ref 3.0–12.0)
Neutro Abs: 2.1 10*3/uL (ref 1.4–7.7)
Neutrophils Relative %: 43.1 % (ref 43.0–77.0)
PLATELETS: 257 10*3/uL (ref 150.0–400.0)
RBC: 4.66 Mil/uL (ref 4.22–5.81)
RDW: 12.9 % (ref 11.5–14.6)
WBC: 4.8 10*3/uL (ref 4.5–10.5)

## 2013-04-17 LAB — HEPATIC FUNCTION PANEL
ALT: 21 U/L (ref 0–53)
AST: 22 U/L (ref 0–37)
Albumin: 4.4 g/dL (ref 3.5–5.2)
Alkaline Phosphatase: 75 U/L (ref 39–117)
Bilirubin, Direct: 0 mg/dL (ref 0.0–0.3)
TOTAL PROTEIN: 7.6 g/dL (ref 6.0–8.3)
Total Bilirubin: 0.8 mg/dL (ref 0.3–1.2)

## 2013-04-17 LAB — BASIC METABOLIC PANEL
BUN: 15 mg/dL (ref 6–23)
CALCIUM: 9.1 mg/dL (ref 8.4–10.5)
CO2: 31 mEq/L (ref 19–32)
CREATININE: 1.1 mg/dL (ref 0.4–1.5)
Chloride: 102 mEq/L (ref 96–112)
GFR: 81.07 mL/min (ref >60.00)
Glucose, Bld: 82 mg/dL (ref 70–99)
Potassium: 3.6 mEq/L (ref 3.5–5.1)
Sodium: 137 mEq/L (ref 135–145)

## 2013-04-17 LAB — LIPID PANEL
CHOL/HDL RATIO: 5
Cholesterol: 252 mg/dL — ABNORMAL HIGH (ref 0–200)
HDL: 46.4 mg/dL (ref >39.00)
LDL Cholesterol: 179 mg/dL — ABNORMAL HIGH (ref 0–99)
Triglycerides: 134 mg/dL (ref 0.0–149.0)
VLDL: 26.8 mg/dL (ref 0.0–40.0)

## 2013-04-17 LAB — TSH: TSH: 4.17 u[IU]/mL (ref 0.35–5.50)

## 2013-04-24 ENCOUNTER — Encounter: Payer: Self-pay | Admitting: Family Medicine

## 2013-04-24 ENCOUNTER — Ambulatory Visit (INDEPENDENT_AMBULATORY_CARE_PROVIDER_SITE_OTHER): Payer: BC Managed Care – PPO | Admitting: Family Medicine

## 2013-04-24 VITALS — BP 118/68 | HR 77 | Temp 97.5°F | Ht 70.0 in | Wt 179.0 lb

## 2013-04-24 DIAGNOSIS — E785 Hyperlipidemia, unspecified: Secondary | ICD-10-CM

## 2013-04-24 DIAGNOSIS — E538 Deficiency of other specified B group vitamins: Secondary | ICD-10-CM

## 2013-04-24 DIAGNOSIS — Z0000 Encounter for general adult medical examination without abnormal findings: Secondary | ICD-10-CM

## 2013-04-24 NOTE — Progress Notes (Signed)
   Subjective:    Patient ID: Jacob Malone, male    DOB: 1965/05/02, 48 y.o.   MRN: [id number]  HPI Patient here for complete physical. Generally very healthy. He takes no regular medications. Nonsmoker. No consistent exercise. Tetanus is up to date but he'll need booster by next year. Colonoscopy a few years ago after some rectal bleeding following hemorrhoid procedure. Hemoglobin is been stable since then. No family history of premature CAD.  Past Medical History  Diagnosis Date  . Chicken pox    Past Surgical History  Procedure Laterality Date  . Hemorroidectomy  2011, 2012  . Nasal septum surgery      reports that he has never smoked. He does not have any smokeless tobacco history on file. His alcohol and drug histories are not on file. family history includes Alcohol abuse in his father; Diabetes in his father. Allergies  Allergen Reactions  . Lansoprazole     "Did not feel right"      Review of Systems  Constitutional: Negative for fever, activity change, appetite change, fatigue and unexpected weight change.  HENT: Negative for congestion, ear pain and trouble swallowing.   Eyes: Negative for pain and visual disturbance.  Respiratory: Negative for cough, shortness of breath and wheezing.   Cardiovascular: Negative for chest pain and palpitations.  Gastrointestinal: Negative for nausea, vomiting, abdominal pain, diarrhea, constipation, blood in stool, abdominal distention and rectal pain.  Endocrine: Negative for polydipsia and polyuria.  Genitourinary: Negative for dysuria, hematuria and testicular pain.  Musculoskeletal: Negative for arthralgias and joint swelling.  Skin: Negative for rash.  Neurological: Negative for dizziness, syncope and headaches.  Hematological: Negative for adenopathy.  Psychiatric/Behavioral: Negative for confusion and dysphoric mood.       Objective:   Physical Exam  Constitutional: He is oriented to person, place, and time. He appears  well-developed and well-nourished. No distress.  HENT:  Head: Normocephalic and atraumatic.  Right Ear: External ear normal.  Left Ear: External ear normal.  Mouth/Throat: Oropharynx is clear and moist.  Eyes: Conjunctivae and EOM are normal. Pupils are equal, round, and reactive to light.  Neck: Normal range of motion. Neck supple. No thyromegaly present.  Cardiovascular: Normal rate, regular rhythm and normal heart sounds.   No murmur heard. Pulmonary/Chest: No respiratory distress. He has no wheezes. He has no rales.  Abdominal: Soft. Bowel sounds are normal. He exhibits no distension and no mass. There is no tenderness. There is no rebound and no guarding.  Genitourinary: Rectum normal and prostate normal.  Musculoskeletal: He exhibits no edema.  Lymphadenopathy:    He has no cervical adenopathy.  Neurological: He is alert and oriented to person, place, and time. He displays normal reflexes. No cranial nerve deficit.  Skin: No rash noted.  Psychiatric: He has a normal mood and affect.          Assessment & Plan:  Complete physical. Elevated lipids. Recommend cholesterol reduction diet and recheck lipids in 4 months. Reassess B12 that point with recent B12 of 233. Currently taking oral replacement. Establish more consistent exercise. Tetanus booster by next year.

## 2013-04-24 NOTE — Patient Instructions (Signed)
Fat and Cholesterol Control Diet  Fat and cholesterol levels in your blood and organs are influenced by your diet. High levels of fat and cholesterol may lead to diseases of the heart, small and large blood vessels, gallbladder, liver, and pancreas.  CONTROLLING FAT AND CHOLESTEROL WITH DIET  Although exercise and lifestyle factors are important, your diet is key. That is because certain foods are known to raise cholesterol and others to lower it. The goal is to balance foods for their effect on cholesterol and more importantly, to replace saturated and trans fat with other types of fat, such as monounsaturated fat, polyunsaturated fat, and omega-3 fatty acids.  On average, a person should consume no more than 15 to 17 g of saturated fat daily. Saturated and trans fats are considered "bad" fats, and they will raise LDL cholesterol. Saturated fats are primarily found in animal products such as meats, butter, and cream. However, that does not mean you need to give up all your favorite foods. Today, there are good tasting, low-fat, low-cholesterol substitutes for most of the things you like to eat. Choose low-fat or nonfat alternatives. Choose round or loin cuts of red meat. These types of cuts are lowest in fat and cholesterol. Chicken (without the skin), fish, veal, and ground turkey breast are great choices. Eliminate fatty meats, such as hot dogs and salami. Even shellfish have little or no saturated fat. Have a 3 oz (85 g) portion when you eat lean meat, poultry, or fish.  Trans fats are also called "partially hydrogenated oils." They are oils that have been scientifically manipulated so that they are solid at room temperature resulting in a longer shelf life and improved taste and texture of foods in which they are added. Trans fats are found in stick margarine, some tub margarines, cookies, crackers, and baked goods.   When baking and cooking, oils are a great substitute for butter. The monounsaturated oils are  especially beneficial since it is believed they lower LDL and raise HDL. The oils you should avoid entirely are saturated tropical oils, such as coconut and palm.   Remember to eat a lot from food groups that are naturally free of saturated and trans fat, including fish, fruit, vegetables, beans, grains (barley, rice, couscous, bulgur wheat), and pasta (without cream sauces).   IDENTIFYING FOODS THAT LOWER FAT AND CHOLESTEROL   Soluble fiber may lower your cholesterol. This type of fiber is found in fruits such as apples, vegetables such as broccoli, potatoes, and carrots, legumes such as beans, peas, and lentils, and grains such as barley. Foods fortified with plant sterols (phytosterol) may also lower cholesterol. You should eat at least 2 g per day of these foods for a cholesterol lowering effect.   Read package labels to identify low-saturated fats, trans fat free, and low-fat foods at the supermarket. Select cheeses that have only 2 to 3 g saturated fat per ounce. Use a heart-healthy tub margarine that is free of trans fats or partially hydrogenated oil. When buying baked goods (cookies, crackers), avoid partially hydrogenated oils. Breads and muffins should be made from whole grains (whole-wheat or whole oat flour, instead of "flour" or "enriched flour"). Buy non-creamy canned soups with reduced salt and no added fats.   FOOD PREPARATION TECHNIQUES   Never deep-fry. If you must fry, either stir-fry, which uses very little fat, or use non-stick cooking sprays. When possible, broil, bake, or roast meats, and steam vegetables. Instead of putting butter or margarine on vegetables, use lemon   and herbs, applesauce, and cinnamon (for squash and sweet potatoes). Use nonfat yogurt, salsa, and low-fat dressings for salads.   LOW-SATURATED FAT / LOW-FAT FOOD SUBSTITUTES  Meats / Saturated Fat (g)  · Avoid: Steak, marbled (3 oz/85 g) / 11 g  · Choose: Steak, lean (3 oz/85 g) / 4 g  · Avoid: Hamburger (3 oz/85 g) / 7  g  · Choose: Hamburger, lean (3 oz/85 g) / 5 g  · Avoid: Ham (3 oz/85 g) / 6 g  · Choose: Ham, lean cut (3 oz/85 g) / 2.4 g  · Avoid: Chicken, with skin, dark meat (3 oz/85 g) / 4 g  · Choose: Chicken, skin removed, dark meat (3 oz/85 g) / 2 g  · Avoid: Chicken, with skin, light meat (3 oz/85 g) / 2.5 g  · Choose: Chicken, skin removed, light meat (3 oz/85 g) / 1 g  Dairy / Saturated Fat (g)  · Avoid: Whole milk (1 cup) / 5 g  · Choose: Low-fat milk, 2% (1 cup) / 3 g  · Choose: Low-fat milk, 1% (1 cup) / 1.5 g  · Choose: Skim milk (1 cup) / 0.3 g  · Avoid: Hard cheese (1 oz/28 g) / 6 g  · Choose: Skim milk cheese (1 oz/28 g) / 2 to 3 g  · Avoid: Cottage cheese, 4% fat (1 cup) / 6.5 g  · Choose: Low-fat cottage cheese, 1% fat (1 cup) / 1.5 g  · Avoid: Ice cream (1 cup) / 9 g  · Choose: Sherbet (1 cup) / 2.5 g  · Choose: Nonfat frozen yogurt (1 cup) / 0.3 g  · Choose: Frozen fruit bar / trace  · Avoid: Whipped cream (1 tbs) / 3.5 g  · Choose: Nondairy whipped topping (1 tbs) / 1 g  Condiments / Saturated Fat (g)  · Avoid: Mayonnaise (1 tbs) / 2 g  · Choose: Low-fat mayonnaise (1 tbs) / 1 g  · Avoid: Butter (1 tbs) / 7 g  · Choose: Extra light margarine (1 tbs) / 1 g  · Avoid: Coconut oil (1 tbs) / 11.8 g  · Choose: Olive oil (1 tbs) / 1.8 g  · Choose: Corn oil (1 tbs) / 1.7 g  · Choose: Safflower oil (1 tbs) / 1.2 g  · Choose: Sunflower oil (1 tbs) / 1.4 g  · Choose: Soybean oil (1 tbs) / 2.4 g  · Choose: Canola oil (1 tbs) / 1 g  Document Released: 01/23/2005 Document Revised: 05/20/2012 Document Reviewed: 07/14/2010  [employer]® Patient Information ©2014 [employer].

## 2013-04-24 NOTE — Progress Notes (Signed)
Pre visit review using our clinic review tool, if applicable. No additional management support is needed unless otherwise documented below in the visit note. 

## 2013-08-06 ENCOUNTER — Ambulatory Visit (INDEPENDENT_AMBULATORY_CARE_PROVIDER_SITE_OTHER): Payer: BC Managed Care – PPO | Admitting: Family Medicine

## 2013-08-06 ENCOUNTER — Encounter: Payer: Self-pay | Admitting: Family Medicine

## 2013-08-06 VITALS — BP 118/80 | HR 70 | Temp 97.6°F | Wt 174.0 lb

## 2013-08-06 DIAGNOSIS — R6889 Other general symptoms and signs: Secondary | ICD-10-CM

## 2013-08-06 NOTE — Progress Notes (Signed)
Pre visit review using our clinic review tool, if applicable. No additional management support is needed unless otherwise documented below in the visit note. 

## 2013-08-06 NOTE — Patient Instructions (Signed)
Touch base in one week if chest congestion symptoms persist.

## 2013-08-06 NOTE — Progress Notes (Signed)
   Subjective:    Patient ID: Jacob Malone, male    DOB: 27-Apr-1965, 48 y.o.   MRN: [id number]  HPI Patient came in initially with complaints of " chest congestion" however, he has difficulty describing his symptoms. He denies any cough. No fevers or chills. Possibly some mild shortness of breath but not with activity. He does not exercise. Nonspecific fatigue. Generally sleeping 7-8 hours per night. Multiple recent labs unremarkable. No pleuritic pain. No chest pain. No appetite changes. He's lost about 5 pounds which he attributes to his efforts. Nonsmoker  Past Medical History  Diagnosis Date  . Chicken pox    Past Surgical History  Procedure Laterality Date  . Hemorroidectomy  2011, 2012  . Nasal septum surgery      reports that he has never smoked. He does not have any smokeless tobacco history on file. His alcohol and drug histories are not on file. family history includes Alcohol abuse in his father; Diabetes in his father. Allergies  Allergen Reactions  . Lansoprazole     "Did not feel right"      Review of Systems  Constitutional: Positive for fatigue. Negative for fever and chills.  Respiratory: Positive for shortness of breath. Negative for cough and wheezing.   Cardiovascular: Negative for chest pain, palpitations and leg swelling.  Gastrointestinal: Negative for abdominal pain.       Objective:   Physical Exam  Constitutional: He appears well-developed and well-nourished.  HENT:  Right Ear: External ear normal.  Left Ear: External ear normal.  Mouth/Throat: Oropharynx is clear and moist.  Neck: Neck supple.  Cardiovascular: Normal rate and regular rhythm.   Pulmonary/Chest: Effort normal and breath sounds normal. No respiratory distress. He has no wheezes. He has no rales.  Lymphadenopathy:    He has no cervical adenopathy.          Assessment & Plan:  Vague chest symptoms. No cough and nonfocal exam. No suggestion of infectious process. Observe for  now. Consider chest x-ray in one week if no better

## 2013-08-28 ENCOUNTER — Other Ambulatory Visit: Payer: BC Managed Care – PPO

## 2013-09-30 ENCOUNTER — Other Ambulatory Visit (INDEPENDENT_AMBULATORY_CARE_PROVIDER_SITE_OTHER): Payer: BC Managed Care – PPO

## 2013-09-30 ENCOUNTER — Other Ambulatory Visit: Payer: BC Managed Care – PPO

## 2013-09-30 DIAGNOSIS — E785 Hyperlipidemia, unspecified: Secondary | ICD-10-CM

## 2013-09-30 DIAGNOSIS — E538 Deficiency of other specified B group vitamins: Secondary | ICD-10-CM

## 2013-10-01 LAB — LIPID PANEL
CHOLESTEROL: 202 mg/dL — AB (ref 0–200)
HDL: 37.3 mg/dL — ABNORMAL LOW (ref >39.00)
LDL Cholesterol: 135 mg/dL — ABNORMAL HIGH (ref 0–99)
NONHDL: 164.7
Total CHOL/HDL Ratio: 5
Triglycerides: 148 mg/dL (ref 0.0–149.0)
VLDL: 29.6 mg/dL (ref 0.0–40.0)

## 2013-10-01 LAB — VITAMIN B12: VITAMIN B 12: 349 pg/mL (ref 211–911)

## 2013-10-02 DIAGNOSIS — G43719 Chronic migraine without aura, intractable, without status migrainosus: Secondary | ICD-10-CM | POA: Insufficient documentation

## 2013-10-02 DIAGNOSIS — G43009 Migraine without aura, not intractable, without status migrainosus: Secondary | ICD-10-CM | POA: Insufficient documentation

## 2013-10-02 DIAGNOSIS — R42 Dizziness and giddiness: Secondary | ICD-10-CM | POA: Insufficient documentation

## 2014-02-11 ENCOUNTER — Encounter: Payer: Self-pay | Admitting: Internal Medicine

## 2014-03-27 ENCOUNTER — Ambulatory Visit: Payer: Self-pay | Admitting: Internal Medicine

## 2014-05-06 ENCOUNTER — Telehealth: Payer: Self-pay | Admitting: Family Medicine

## 2014-05-06 ENCOUNTER — Ambulatory Visit (INDEPENDENT_AMBULATORY_CARE_PROVIDER_SITE_OTHER): Payer: BLUE CROSS/BLUE SHIELD | Admitting: Family

## 2014-05-06 ENCOUNTER — Other Ambulatory Visit (INDEPENDENT_AMBULATORY_CARE_PROVIDER_SITE_OTHER): Payer: BLUE CROSS/BLUE SHIELD

## 2014-05-06 VITALS — BP 112/62 | HR 84 | Temp 98.0°F | Resp 16 | Wt 168.0 lb

## 2014-05-06 DIAGNOSIS — R1031 Right lower quadrant pain: Secondary | ICD-10-CM | POA: Insufficient documentation

## 2014-05-06 LAB — BASIC METABOLIC PANEL
BUN: 15 mg/dL (ref 6–23)
CHLORIDE: 102 meq/L (ref 96–112)
CO2: 33 mEq/L — ABNORMAL HIGH (ref 19–32)
Calcium: 9.7 mg/dL (ref 8.4–10.5)
Creatinine, Ser: 1.12 mg/dL (ref 0.40–1.50)
GFR: 74.9 mL/min (ref >60.00)
GLUCOSE: 92 mg/dL (ref 70–99)
POTASSIUM: 4.7 meq/L (ref 3.5–5.1)
Sodium: 135 mEq/L (ref 135–145)

## 2014-05-06 NOTE — Progress Notes (Signed)
Pre visit review using our clinic review tool, if applicable. No additional management support is needed unless otherwise documented below in the visit note. 

## 2014-05-06 NOTE — Patient Instructions (Addendum)
Thank you for choosing [facility].  Summary/Instructions:  If your symptoms worsen or fail to improve, please contact our office for further instruction, or in case of emergency go directly to the emergency room at the closest medical facility.   Appendicitis Appendicitis is when the appendix is swollen (inflamed). The inflammation can lead to developing a hole (perforation) and a collection of pus (abscess). CAUSES  There is not always an obvious cause of appendicitis. Sometimes it is caused by an obstruction in the appendix. The obstruction can be caused by:  A small, hard, pea-sized ball of stool (fecalith).  Enlarged lymph glands in the appendix. SYMPTOMS   Pain around your belly button (navel) that moves toward your lower right belly (abdomen). The pain can become more severe and sharp as time passes.  Tenderness in the lower right abdomen. Pain gets worse if you cough or make a sudden movement.  Feeling sick to your stomach (nauseous).  Throwing up (vomiting).  Loss of appetite.  Fever.  Constipation.  Diarrhea.  Generally not feeling well. DIAGNOSIS   Physical exam.  Blood tests.  Urine test.  X-rays or a CT scan may confirm the diagnosis. TREATMENT  Once the diagnosis of appendicitis is made, the most common treatment is to remove the appendix as soon as possible. This procedure is called appendectomy. In an open appendectomy, a cut (incision) is made in the lower right abdomen and the appendix is removed. In a laparoscopic appendectomy, usually 3 small incisions are made. Long, thin instruments and a camera tube are used to remove the appendix. Most patients go home in 24 to 48 hours after appendectomy. In some situations, the appendix may have already perforated and an abscess may have formed. The abscess may have a "wall" around it as seen on a CT scan. In this case, a drain may be placed into the abscess to remove fluid, and you may be treated with  antibiotic medicines that kill germs. The medicine is given through a tube in your vein (IV). Once the abscess has resolved, it may or may not be necessary to have an appendectomy. You may need to stay in the hospital longer than 48 hours. Document Released: 01/23/2005 Document Revised: 07/25/2011 Document Reviewed: 04/20/2009 [facility] Patient Information 2015 [place], [place]. This information is not intended to replace advice given to you by your health care provider. Make sure you discuss any questions you have with your health care provider.

## 2014-05-06 NOTE — Telephone Encounter (Signed)
Noted  

## 2014-05-06 NOTE — Telephone Encounter (Signed)
Patient Name: Jacob Malone DOB: [birth year] Initial Comment Caller states, has abd pains, started last night, pain now Moderate Nurse Assessment Nurse: Charna Elizabethrumbull, RN, Cathy Date/Time (Eastern Time): 05/06/2014 10:14:02 AM Confirm and document reason for call. If symptomatic, describe symptoms. ---Caller states he developed abdominal pain yesterday. No fever. No injury in the past 3 days. Has the patient traveled out of the country within the last 30 days? ---No Does the patient require triage? ---Yes Related visit to physician within the last 2 weeks? ---No Does the PT have any chronic conditions? (i.e. diabetes, asthma, etc.) ---Yes List chronic conditions. ---Neurologic problems Guidelines Guideline Title Affirmed Question Affirmed Notes Abdominal Pain - Male [1] MILD-MODERATE pain AND [2] constant AND [3] present > 2 hours Final Disposition User See Physician within 4 Hours (or PCP triage) Charna Elizabethrumbull, RN, Lynden Angathy Comments [facility]15[facility] today.

## 2014-05-06 NOTE — Assessment & Plan Note (Signed)
Right periumbilical/RLQ pain. Although slightly improved cannot rule out appendicitis. Obtain CT to rule out. Pending CT scan, patient instructed to seek emergency care if symptoms worsen before the imaging. Follow up pending CT.

## 2014-05-06 NOTE — Progress Notes (Signed)
   Subjective:    Patient ID: Jacob Malone, male    DOB: July 17, 1965, 49 y.o.   MRN: [id number]  Chief Complaint  Patient presents with  . Abdominal Pain    Constant, all over pain     HPI:  Jacob Malone is a 49 y.o. male who presents today for an acute visit.  Associated symptom of abdominal pain started last night. Has tried OTC medications which did not seem to help. Has had a couple of bowel movements since the pain started which provided no relief. Pain is described as constant mixed type of pain with an intensity of 4-5/10 but at its worst was 5-6/10.   Allergies  Allergen Reactions  . Lansoprazole     "Did not feel right"    Current Outpatient Prescriptions  Medication Sig Dispense Refill  . Coenzyme Q10 (COQ10 PO) Take by mouth daily.    . LamoTRIgine 100 MG TB24 Take 100 mg by mouth daily.    Marland Kitchen. MAGNESIUM LACTATE PO Take 84 mg by mouth 2 (two) times daily.    . riboflavin (VITAMIN B-2) 100 MG TABS tablet Take 400 mg by mouth daily.    . traZODone (DESYREL) 25 mg TABS tablet Take 25 mg by mouth at bedtime.    . meloxicam (MOBIC) 15 MG tablet Take 1 tablet (15 mg total) by mouth daily. 30 tablet 3   No current facility-administered medications for this visit.    Review of Systems  Constitutional: Positive for chills. Negative for fever.  Gastrointestinal: Positive for abdominal pain and abdominal distention. Negative for nausea, vomiting, constipation and blood in stool.      Objective:    BP 112/62 mmHg  Pulse 84  Temp(Src) 98 F (36.7 C) (Oral)  Resp 16  Wt 168 lb (76.204 kg)  SpO2 98% Nursing note and vital signs reviewed.  Physical Exam  Constitutional: He is oriented to person, place, and time. He appears well-developed and well-nourished. No distress.  Cardiovascular: Normal rate, regular rhythm, normal heart sounds and intact distal pulses.   Pulmonary/Chest: Effort normal and breath sounds normal.  Abdominal: Normal appearance and bowel sounds are  normal. He exhibits no ascites and no mass. There is no hepatosplenomegaly. There is tenderness in the right lower quadrant and periumbilical area. There is no rigidity, no rebound, no guarding and negative Murphy's sign.  Neurological: He is alert and oriented to person, place, and time.  Skin: Skin is warm and dry.  Psychiatric: He has a normal mood and affect. His behavior is normal. Judgment and thought content normal.       Assessment & Plan:

## 2014-05-07 ENCOUNTER — Telehealth: Payer: Self-pay | Admitting: Family

## 2014-05-07 ENCOUNTER — Ambulatory Visit (INDEPENDENT_AMBULATORY_CARE_PROVIDER_SITE_OTHER)
Admission: RE | Admit: 2014-05-07 | Discharge: 2014-05-07 | Disposition: A | Discharge status: Home / Self Care | Payer: BLUE CROSS/BLUE SHIELD | Source: Ambulatory Visit | Attending: Family | Admitting: Family

## 2014-05-07 ENCOUNTER — Encounter (HOSPITAL_COMMUNITY): Payer: Self-pay | Admitting: *Deleted

## 2014-05-07 ENCOUNTER — Emergency Department (HOSPITAL_COMMUNITY)
Admission: EM | Admit: 2014-05-07 | Discharge: 2014-05-07 | Disposition: A | Discharge status: Home / Self Care | Payer: BLUE CROSS/BLUE SHIELD | Attending: Emergency Medicine | Admitting: Emergency Medicine

## 2014-05-07 DIAGNOSIS — Z86010 Personal history of colonic polyps: Secondary | ICD-10-CM | POA: Insufficient documentation

## 2014-05-07 DIAGNOSIS — K3589 Other acute appendicitis: Secondary | ICD-10-CM | POA: Insufficient documentation

## 2014-05-07 DIAGNOSIS — Z862 Personal history of diseases of the blood and blood-forming organs and certain disorders involving the immune mechanism: Secondary | ICD-10-CM | POA: Diagnosis not present

## 2014-05-07 DIAGNOSIS — Z791 Long term (current) use of non-steroidal anti-inflammatories (NSAID): Secondary | ICD-10-CM | POA: Diagnosis not present

## 2014-05-07 DIAGNOSIS — Z8619 Personal history of other infectious and parasitic diseases: Secondary | ICD-10-CM | POA: Insufficient documentation

## 2014-05-07 DIAGNOSIS — Z79899 Other long term (current) drug therapy: Secondary | ICD-10-CM | POA: Diagnosis not present

## 2014-05-07 DIAGNOSIS — R1031 Right lower quadrant pain: Secondary | ICD-10-CM

## 2014-05-07 LAB — CBC WITH DIFFERENTIAL/PLATELET
BASOS PCT: 0 % (ref 0–1)
Basophils Absolute: 0 10*3/uL (ref 0.0–0.1)
EOS PCT: 1 % (ref 0–5)
Eosinophils Absolute: 0 10*3/uL (ref 0.0–0.7)
HCT: 41.2 % (ref 39.0–52.0)
HEMOGLOBIN: 14 g/dL (ref 13.0–17.0)
LYMPHS ABS: 1.3 10*3/uL (ref 0.7–4.0)
LYMPHS PCT: 28 % (ref 12–46)
MCH: 30.6 pg (ref 26.0–34.0)
MCHC: 34 g/dL (ref 30.0–36.0)
MCV: 90 fL (ref 78.0–100.0)
MONO ABS: 0.5 10*3/uL (ref 0.1–1.0)
MONOS PCT: 10 % (ref 3–12)
NEUTROS PCT: 61 % (ref 43–77)
Neutro Abs: 3 10*3/uL (ref 1.7–7.7)
PLATELETS: 216 10*3/uL (ref 150–400)
RBC: 4.58 MIL/uL (ref 4.22–5.81)
RDW: 12.7 % (ref 11.5–15.5)
WBC: 4.8 10*3/uL (ref 4.0–10.5)

## 2014-05-07 LAB — URINALYSIS, ROUTINE W REFLEX MICROSCOPIC
BILIRUBIN URINE: NEGATIVE
GLUCOSE, UA: NEGATIVE mg/dL
Ketones, ur: 15 mg/dL — AB
Leukocytes, UA: NEGATIVE
Nitrite: NEGATIVE
PROTEIN: NEGATIVE mg/dL
Specific Gravity, Urine: >1.046 — ABNORMAL HIGH (ref 1.005–1.030)
UROBILINOGEN UA: 0.2 mg/dL (ref 0.0–1.0)
pH: 5 (ref 5.0–8.0)

## 2014-05-07 LAB — COMPREHENSIVE METABOLIC PANEL
ALT: 20 U/L (ref 0–53)
AST: 25 U/L (ref 0–37)
Albumin: 4 g/dL (ref 3.5–5.2)
Alkaline Phosphatase: 73 U/L (ref 39–117)
Anion gap: 12 (ref 5–15)
BILIRUBIN TOTAL: 0.8 mg/dL (ref 0.3–1.2)
BUN: 11 mg/dL (ref 6–23)
CHLORIDE: 100 mmol/L (ref 96–112)
CO2: 26 mmol/L (ref 19–32)
Calcium: 9.5 mg/dL (ref 8.4–10.5)
Creatinine, Ser: 1.24 mg/dL (ref 0.50–1.35)
GFR, EST AFRICAN AMERICAN: 79 mL/min — AB (ref >90)
GFR, EST NON AFRICAN AMERICAN: 68 mL/min — AB (ref >90)
GLUCOSE: 94 mg/dL (ref 70–99)
Potassium: 4.1 mmol/L (ref 3.5–5.1)
Sodium: 138 mmol/L (ref 135–145)
Total Protein: 7.6 g/dL (ref 6.0–8.3)

## 2014-05-07 LAB — URINE MICROSCOPIC-ADD ON

## 2014-05-07 MED ORDER — IOHEXOL 300 MG/ML  SOLN
100.0000 mL | Freq: Once | INTRAMUSCULAR | Status: AC | PRN
  Administered 2014-05-07: 100 mL via INTRAVENOUS

## 2014-05-07 MED ORDER — OXYCODONE-ACETAMINOPHEN 5-325 MG PO TABS: 1.0000 | ORAL_TABLET | ORAL | Status: DC | PRN

## 2014-05-07 NOTE — ED Provider Notes (Signed)
Otherwise healthy 49 year old male presents with right lower quadrant pain, CT scans ordered by family doctor and resulted, patient sent to the emergency department because of changes in the appendix on CAT scan. On exam the patient has tenderness in the right lower abdomen, right midabdomen and suprapubic area. There is no guarding, no peritoneal signs. Heart and lung exams are normal, no edema, otherwise well-appearing and nontoxic in appearance. I personally reviewed the CT scan upon there to be some  changes around the appendix, no obvious fecalith, surgery will need to be consulted.  Non peritoneal,  Surgical consult - state they doubt appy - will be d/c to f/u with them.  Medical screening examination/treatment/procedure(s) were conducted as a shared visit with non-physician practitioner(s) and myself.  I personally evaluated the patient during the encounter.  Clinical Impression:   Abdominal pain      Eber HongBrian Nichola Cieslinski, MD 05/07/14 [phone]

## 2014-05-07 NOTE — ED Provider Notes (Signed)
CSN: [id number]     Arrival date & time 05/07/14  1204 History   First MD Initiated Contact with Patient 05/07/14 1209     Chief Complaint  Patient presents with  . Abdominal Pain     (Consider location/radiation/quality/duration/timing/severity/associated sxs/prior Treatment) The history is provided by the patient and medical records.    This is a 49 year old male with past medical history significant for iron deficiency anemia, hemorrhoids, presenting to the ED for abdominal pain.  Patient states pain began yesterday, localized to his suprapubic region and right lower abdomen. He states pain is waxing and waning in nature, but with it occurs it is severe. He denies associated nausea, vomiting, diarrhea, fever, chills, urinary symptoms, or flank pain. He was sent for an outpatient CT today and referred to the ED due to findings. He states he continues to have pain.  Prior hemorrhoidectomy, no other abdominal surgeries.  VSS.  Past Medical History  Diagnosis Date  . Chicken pox   . Iron deficiency anemia   . Hemorrhoids   . Colon polyp     sessile - lost in stool   Past Surgical History  Procedure Laterality Date  . Hemorroidectomy  2011, 2012  . Nasal septum surgery     Family History  Problem Relation Age of Onset  . Alcohol abuse Father   . Diabetes Father    History  Substance Use Topics  . Smoking status: Never Smoker   . Smokeless tobacco: Not on file  . Alcohol Use: No    Review of Systems  Gastrointestinal: Positive for abdominal pain.  All other systems reviewed and are negative.     Allergies  Lansoprazole  Home Medications   Prior to Admission medications   Medication Sig Start Date End Date Taking? Authorizing Provider  Coenzyme Q10 (COQ10 PO) Take by mouth daily.    Historical Provider, MD  LamoTRIgine 100 MG TB24 Take 100 mg by mouth daily.    Historical Provider, MD  MAGNESIUM LACTATE PO Take 84 mg by mouth 2 (two) times daily.    Historical  Provider, MD  meloxicam (MOBIC) 15 MG tablet Take 1 tablet (15 mg total) by mouth daily. 04/11/13   Eulas Post, MD  riboflavin (VITAMIN B-2) 100 MG TABS tablet Take 400 mg by mouth daily.    Historical Provider, MD  traZODone (DESYREL) 25 mg TABS tablet Take 25 mg by mouth at bedtime.    Historical Provider, MD   BP 111/64 mmHg  Pulse 73  Temp(Src) 97.8 F (36.6 C) (Oral)  Resp 20  Ht '5\' 11"'  (1.803 m)  Wt 160 lb (72.576 kg)  BMI 22.33 kg/m2  SpO2 100%   Physical Exam  Constitutional: He is oriented to person, place, and time. He appears well-developed and well-nourished.  HENT:  Head: Normocephalic and atraumatic.  Mouth/Throat: Oropharynx is clear and moist.  Eyes: Conjunctivae and EOM are normal. Pupils are equal, round, and reactive to light.  Neck: Normal range of motion.  Cardiovascular: Normal rate, regular rhythm and normal heart sounds.   Pulmonary/Chest: Effort normal and breath sounds normal.  Abdominal: Soft. Bowel sounds are normal. There is tenderness in the right lower quadrant and suprapubic area. There is no guarding.  Musculoskeletal: Normal range of motion.  Neurological: He is alert and oriented to person, place, and time.  Skin: Skin is warm and dry.  Psychiatric: He has a normal mood and affect.  Nursing note and vitals reviewed.   ED Course  Procedures (  including critical care time) Labs Review Labs Reviewed  COMPREHENSIVE METABOLIC PANEL - Abnormal; Notable for the following:    GFR calc non Af Amer 68 (*)    GFR calc Af Amer 79 (*)    All other components within normal limits  URINALYSIS, ROUTINE W REFLEX MICROSCOPIC - Abnormal; Notable for the following:    Specific Gravity, Urine >1.046 (*)    Hgb urine dipstick TRACE (*)    Ketones, ur 15 (*)    All other components within normal limits  CBC WITH DIFFERENTIAL/PLATELET  URINE MICROSCOPIC-ADD ON    Imaging Review Ct Abdomen Pelvis W Contrast  05/07/2014   CLINICAL DATA:  49 year old  male male with right lower quadrant pain, possible appendicitis. Initial encounter.  EXAM: CT ABDOMEN AND PELVIS WITH CONTRAST  TECHNIQUE: Multidetector CT imaging of the abdomen and pelvis was performed using the standard protocol following bolus administration of intravenous contrast.  CONTRAST:  131m OMNIPAQUE IOHEXOL 300 MG/ML  SOLN  COMPARISON:  Chest and abdominal series 12/31/09.  FINDINGS: Negative lung bases.  No pericardial or pleural effusion.  Lower lumbar chronic disc and endplate degeneration. No acute osseous abnormality identified.  No pelvic free fluid.  Unremarkable bladder.  Negative distal colon.  Sigmoid and left colon are within normal limits. Negative transverse colon. More retained stool beginning in the proximal transverse and at the hepatic flexure, but oral contrast has reached the hepatic flexure. Negative right colon. Negative terminal ileum.  The appendix is well visualized and courses near the right iliac bifurcation toward the posterior lateral pelvis from the cecum. The base of the appendix appears fairly normal and contains contrast. There may also be contrast extending to the mid appendix. However, the mid and distal appendix appear abnormally thickened, from 9 to 12 mm diameter. The appendix is also well seen on coronal images 29- 38. Despite these changes there is no definite stranding in the para-appendiceal fat.  No dilated small bowel. Negative stomach and duodenum. There are subcentimeter low-density areas in the right hepatic lobe mostly seen on series 2 images 16 and 17, which are nonspecific but likely benign. The largest of these seems to have simple fluid densitometry (20 Hounsfield units). Liver enhancement otherwise within normal limits. Gallbladder, spleen, pancreas, and adrenal glands within normal limits. Portal venous system is patent. Major arterial structures are patent with no atherosclerosis identified. Bilateral renal enhancement and contrast excretion are  within normal limits. There is a 2-3 mm right upper pole stone on series 2, image 26. There is a right midpole simple appearing renal cysts. No perinephric stranding, hydronephrosis or hydroureter. No abdominal free fluid or lymphadenopathy.  IMPRESSION: 1. The appendix is abnormally thickened, but not definitely inflamed. Differential considerations include early acute appendicitis and chronic appendicitis. 2. Otherwise no acute or inflammatory process in the abdomen or pelvis. 3. Right nephrolithiasis.   Electronically Signed   By: HGenevie AnnM.D.   On: 05/07/2014 11:24     EKG Interpretation None      MDM   Final diagnoses:  Other acute appendicitis   49year old male with 24 hours of abdominal pain. Outpatient CT revealing thickened appendix without inflammatory changes or abscess formation-- questionable early appendicitis versus chronic appendicitis.  On exam, patient afebrile and nontoxic in appearance. He is tender in his right lower quadrant and suprapubic regions.  Labwork reassuring, trace hematuria noted which may be due to right kidney stone. Case discussed with general surgery, will review films and evaluate patient in the  ED to determine treatment plan.  2:47 PM General surgery has met with patient and was given options of IP observation vs OP symptomatic treatment-- he elected for discharge home.  Will d/c home with pain meds-- notified this may cause constipation and recommended stool softener.  FU with PCP regarding hematuria.  Discussed plan with patient, he/she acknowledged understanding and agreed with plan of care.  Return precautions given for new or worsening symptoms.  Larene Pickett, PA-C 05/07/14 1518  Noemi Chapel, MD 05/07/14 [phone]

## 2014-05-07 NOTE — ED Notes (Signed)
Pt A&Ox4, ambulatory at d/c with steady gait, NAD 

## 2014-05-07 NOTE — Discharge Instructions (Signed)
Take the prescribed medication as directed to help with pain. May also wish to use a stool softener to help aid bowel movements. Follow-up with your primary care physician Return to the ED for new or worsening symptoms-- fever, chills, nausea, vomiting, or diarrhea.

## 2014-05-07 NOTE — Consult Note (Signed)
Jacob Malone Feb 20, 1965  [id number].   Requesting MD: Dr. Noemi Chapel Chief Complaint/Reason for Consult: abdominal pain HPI: this is a 49 yo white male who began having some suprapubic abdominal pain starting on Tuesday.  It waxes and wanes.  He denies any fevers, chills, diarrhea, nausea, or vomiting.  No dysuria, frequency, or urgency.  He has had a good appetite and has been eating.  He saw his PCP office yesterday and they sent him for a CT scan which was completed and showed a slightly distended tip of his appendix, but no periappendiceal inflammation.  The rest of his appendix filled with contrast and was normal in caliber.  He was sent to the [facility] today where he was found to have a normal WBC and no left shift.  We have been asked to evaluate him for further recommendations.  ROS : Please see HPI, otherwise negative  Family History  Problem Relation Age of Onset  . Alcohol abuse Father   . Diabetes Father     Past Medical History  Diagnosis Date  . Chicken pox   . Iron deficiency anemia   . Hemorrhoids   . Colon polyp     sessile - lost in stool    Past Surgical History  Procedure Laterality Date  . Hemorroidectomy  2011, 2012  . Nasal septum surgery      Social History:  reports that he has never smoked. He does not have any smokeless tobacco history on file. He reports that he does not drink alcohol or use illicit drugs.  Allergies:  Allergies  Allergen Reactions  . Lansoprazole     "Did not feel right"     (Not in a hospital admission)  Blood pressure 132/78, pulse 71, temperature 97.8 F (36.6 C), temperature source Oral, resp. rate 16, height _0  (1.803 m), weight 72.576 kg (160 lb), SpO2 99 %. Physical Exam: General: pleasant, WD, WN white male who is laying in bed in NAD HEENT: head is normocephalic, atraumatic.  Sclera are noninjected.  PERRL.  Ears and nose without any masses or lesions.  Mouth is pink and moist Heart: regular, rate, and rhythm.   Normal s1,s2. No obvious murmurs, gallops, or rubs noted.  Palpable radial and pedal pulses bilaterally Lungs: CTAB, no wheezes, rhonchi, or rales noted.  Respiratory effort nonlabored Abd: soft, NT except minimal in his suprapubic region.  No pain in his RLQ at all, ND, +BS, no masses, hernias, or organomegaly MS: all 4 extremities are symmetrical with no cyanosis, clubbing, or edema. Skin: warm and dry with no masses, lesions, or rashes Psych: A&Ox3 with an appropriate affect.    Results for orders placed or performed during the hospital encounter of 05/07/14 (from the past 48 hour(s))  CBC with Differential     Status: None   Collection Time: 05/07/14 12:38 PM  Result Value Ref Range   WBC 4.8 4.0 - 10.5 K/uL   RBC 4.58 4.22 - 5.81 MIL/uL   Hemoglobin 14.0 13.0 - 17.0 g/dL   HCT 41.2 39.0 - 52.0 %   MCV 90.0 78.0 - 100.0 fL   MCH 30.6 26.0 - 34.0 pg   MCHC 34.0 30.0 - 36.0 g/dL   RDW 12.7 11.5 - 15.5 %   Platelets 216 150 - 400 K/uL   Neutrophils Relative % 61 43 - 77 %   Neutro Abs 3.0 1.7 - 7.7 K/uL   Lymphocytes Relative 28 12 - 46 %   Lymphs Abs 1.3  0.7 - 4.0 K/uL   Monocytes Relative 10 3 - 12 %   Monocytes Absolute 0.5 0.1 - 1.0 K/uL   Eosinophils Relative 1 0 - 5 %   Eosinophils Absolute 0.0 0.0 - 0.7 K/uL   Basophils Relative 0 0 - 1 %   Basophils Absolute 0.0 0.0 - 0.1 K/uL  Comprehensive metabolic panel     Status: Abnormal   Collection Time: 05/07/14 12:38 PM  Result Value Ref Range   Sodium 138 135 - 145 mmol/L   Potassium 4.1 3.5 - 5.1 mmol/L   Chloride 100 96 - 112 mmol/L   CO2 26 19 - 32 mmol/L   Glucose, Bld 94 70 - 99 mg/dL   BUN 11 6 - 23 mg/dL   Creatinine, Ser 1.24 0.50 - 1.35 mg/dL   Calcium 9.5 8.4 - 10.5 mg/dL   Total Protein 7.6 6.0 - 8.3 g/dL   Albumin 4.0 3.5 - 5.2 g/dL   AST 25 0 - 37 U/L   ALT 20 0 - 53 U/L   Alkaline Phosphatase 73 39 - 117 U/L   Total Bilirubin 0.8 0.3 - 1.2 mg/dL   GFR calc non Af Amer 68 (L) >90 mL/min   GFR calc Af  Amer 79 (L) >90 mL/min    Comment: (NOTE) The eGFR has been calculated using the CKD EPI equation. This calculation has not been validated in all clinical situations. eGFR's persistently <90 mL/min signify possible Chronic Kidney Disease.    Anion gap 12 5 - 15  Urinalysis, Routine w reflex microscopic     Status: Abnormal   Collection Time: 05/07/14  1:08 PM  Result Value Ref Range   Color, Urine YELLOW YELLOW   APPearance CLEAR CLEAR   Specific Gravity, Urine >1.046 (H) 1.005 - 1.030   pH 5.0 5.0 - 8.0   Glucose, UA NEGATIVE NEGATIVE mg/dL   Hgb urine dipstick TRACE (A) NEGATIVE   Bilirubin Urine NEGATIVE NEGATIVE   Ketones, ur 15 (A) NEGATIVE mg/dL   Protein, ur NEGATIVE NEGATIVE mg/dL   Urobilinogen, UA 0.2 0.0 - 1.0 mg/dL   Nitrite NEGATIVE NEGATIVE   Leukocytes, UA NEGATIVE NEGATIVE  Urine microscopic-add on     Status: None   Collection Time: 05/07/14  1:08 PM  Result Value Ref Range   Squamous Epithelial / LPF RARE RARE   WBC, UA 0-2 <3 WBC/hpf   RBC / HPF 0-2 <3 RBC/hpf   Bacteria, UA RARE RARE   Ct Abdomen Pelvis W Contrast  05/07/2014   CLINICAL DATA:  50 year old male male with right lower quadrant pain, possible appendicitis. Initial encounter.  EXAM: CT ABDOMEN AND PELVIS WITH CONTRAST  TECHNIQUE: Multidetector CT imaging of the abdomen and pelvis was performed using the standard protocol following bolus administration of intravenous contrast.  CONTRAST:  125m OMNIPAQUE IOHEXOL 300 MG/ML  SOLN  COMPARISON:  Chest and abdominal series 12/31/09.  FINDINGS: Negative lung bases.  No pericardial or pleural effusion.  Lower lumbar chronic disc and endplate degeneration. No acute osseous abnormality identified.  No pelvic free fluid.  Unremarkable bladder.  Negative distal colon.  Sigmoid and left colon are within normal limits. Negative transverse colon. More retained stool beginning in the proximal transverse and at the hepatic flexure, but oral contrast has reached the  hepatic flexure. Negative right colon. Negative terminal ileum.  The appendix is well visualized and courses near the right iliac bifurcation toward the posterior lateral pelvis from the cecum. The base of the appendix appears  fairly normal and contains contrast. There may also be contrast extending to the mid appendix. However, the mid and distal appendix appear abnormally thickened, from 9 to 12 mm diameter. The appendix is also well seen on coronal images 29- 38. Despite these changes there is no definite stranding in the para-appendiceal fat.  No dilated small bowel. Negative stomach and duodenum. There are subcentimeter low-density areas in the right hepatic lobe mostly seen on series 2 images 16 and 17, which are nonspecific but likely benign. The largest of these seems to have simple fluid densitometry (20 Hounsfield units). Liver enhancement otherwise within normal limits. Gallbladder, spleen, pancreas, and adrenal glands within normal limits. Portal venous system is patent. Major arterial structures are patent with no atherosclerosis identified. Bilateral renal enhancement and contrast excretion are within normal limits. There is a 2-3 mm right upper pole stone on series 2, image 26. There is a right midpole simple appearing renal cysts. No perinephric stranding, hydronephrosis or hydroureter. No abdominal free fluid or lymphadenopathy.  IMPRESSION: 1. The appendix is abnormally thickened, but not definitely inflamed. Differential considerations include early acute appendicitis and chronic appendicitis. 2. Otherwise no acute or inflammatory process in the abdomen or pelvis. 3. Right nephrolithiasis.   Electronically Signed   By: Genevie Ann M.D.   On: 05/07/2014 11:24       Assessment/Plan 1. Abdominal pain, suprapubic, unknown etiology  Plan: 1. The patient's story and physical exam are not consistent with appendicitis.  The patient currently is not having any RLQ pain.  He is only having minimal  suprapubic discomfort for which he describes as a bloating feeling.  His pain was worse this morning, but has improved throughout the day.  I gave him options to go home and monitor for fevers, nausea, vomiting, diarrhea, or worsening pain migrating to the RLQ vs being admitted to the hospital for observation to recheck labs in the morning with serial abdominal exams.  He has chosen to go home and monitor himself.  He knows to return if he develops any of the symptoms mentioned above.  I think this is appropriate.  Ari Bernabei E 05/07/2014, 3:36 PM Pager: [phone]

## 2014-05-07 NOTE — ED Notes (Signed)
Pain in abdomen started Tues with cramping pain 8/10 at times.  Pt went to [facility] and had imaging done and was sent here for possible kidney stones and appendicitis.

## 2014-05-07 NOTE — Telephone Encounter (Signed)
Spoke with patient via phone following his CT scan. Discussed the abnormal results of his appendix and cannot rule out early appendicitis. Recommendation was made to send him to the ED for follow up.

## 2014-05-22 ENCOUNTER — Other Ambulatory Visit (INDEPENDENT_AMBULATORY_CARE_PROVIDER_SITE_OTHER): Payer: BLUE CROSS/BLUE SHIELD

## 2014-05-22 DIAGNOSIS — Z0000 Encounter for general adult medical examination without abnormal findings: Secondary | ICD-10-CM | POA: Diagnosis not present

## 2014-05-22 LAB — POCT URINALYSIS DIPSTICK
Bilirubin, UA: NEGATIVE
Blood, UA: NEGATIVE
GLUCOSE UA: NEGATIVE
Ketones, UA: NEGATIVE
Leukocytes, UA: NEGATIVE
Nitrite, UA: NEGATIVE
PH UA: 5
Protein, UA: NEGATIVE
SPEC GRAV UA: 1.02
Urobilinogen, UA: 0.2

## 2014-05-22 LAB — CBC WITH DIFFERENTIAL/PLATELET
BASOS ABS: 0 10*3/uL (ref 0.0–0.1)
Basophils Relative: 0.8 % (ref 0.0–3.0)
Eosinophils Absolute: 0 10*3/uL (ref 0.0–0.7)
Eosinophils Relative: 1.3 % (ref 0.0–5.0)
HEMATOCRIT: 40.5 % (ref 39.0–52.0)
Hemoglobin: 13.9 g/dL (ref 13.0–17.0)
LYMPHS ABS: 1.9 10*3/uL (ref 0.7–4.0)
Lymphocytes Relative: 49.6 % — ABNORMAL HIGH (ref 12.0–46.0)
MCHC: 34.3 g/dL (ref 30.0–36.0)
MCV: 88.6 fl (ref 78.0–100.0)
MONO ABS: 0.4 10*3/uL (ref 0.1–1.0)
MONOS PCT: 10.6 % (ref 3.0–12.0)
Neutro Abs: 1.4 10*3/uL (ref 1.4–7.7)
Neutrophils Relative %: 37.7 % — ABNORMAL LOW (ref 43.0–77.0)
PLATELETS: 254 10*3/uL (ref 150.0–400.0)
RBC: 4.58 Mil/uL (ref 4.22–5.81)
RDW: 12.8 % (ref 11.5–15.5)
WBC: 3.8 10*3/uL — ABNORMAL LOW (ref 4.0–10.5)

## 2014-05-22 LAB — BASIC METABOLIC PANEL
BUN: 13 mg/dL (ref 6–23)
CHLORIDE: 102 meq/L (ref 96–112)
CO2: 30 mEq/L (ref 19–32)
Calcium: 9.7 mg/dL (ref 8.4–10.5)
Creatinine, Ser: 1.09 mg/dL (ref 0.40–1.50)
GFR: 77.27 mL/min (ref >60.00)
GLUCOSE: 82 mg/dL (ref 70–99)
POTASSIUM: 4.1 meq/L (ref 3.5–5.1)
SODIUM: 138 meq/L (ref 135–145)

## 2014-05-22 LAB — HEPATIC FUNCTION PANEL
ALT: 15 U/L (ref 0–53)
AST: 18 U/L (ref 0–37)
Albumin: 4.2 g/dL (ref 3.5–5.2)
Alkaline Phosphatase: 73 U/L (ref 39–117)
BILIRUBIN TOTAL: 0.7 mg/dL (ref 0.2–1.2)
Bilirubin, Direct: 0.1 mg/dL (ref 0.0–0.3)
Total Protein: 7.5 g/dL (ref 6.0–8.3)

## 2014-05-22 LAB — LIPID PANEL
CHOLESTEROL: 218 mg/dL — AB (ref 0–200)
HDL: 40.7 mg/dL (ref >39.00)
LDL Cholesterol: 145 mg/dL — ABNORMAL HIGH (ref 0–99)
NONHDL: 177.3
Total CHOL/HDL Ratio: 5
Triglycerides: 160 mg/dL — ABNORMAL HIGH (ref 0.0–149.0)
VLDL: 32 mg/dL (ref 0.0–40.0)

## 2014-05-22 LAB — TSH: TSH: 4.17 u[IU]/mL (ref 0.35–4.50)

## 2014-05-22 NOTE — Addendum Note (Signed)
Addended by: Alfred LevinsWYRICK, CINDY D on: 05/22/2014 09:19 AM   Modules accepted: Orders

## 2014-05-29 ENCOUNTER — Ambulatory Visit (INDEPENDENT_AMBULATORY_CARE_PROVIDER_SITE_OTHER): Payer: BLUE CROSS/BLUE SHIELD | Admitting: Family Medicine

## 2014-05-29 ENCOUNTER — Encounter: Payer: Self-pay | Admitting: Family Medicine

## 2014-05-29 VITALS — BP 118/70 | HR 72 | Temp 97.6°F | Ht 70.0 in | Wt 167.0 lb

## 2014-05-29 DIAGNOSIS — Z23 Encounter for immunization: Secondary | ICD-10-CM | POA: Diagnosis not present

## 2014-05-29 DIAGNOSIS — Z0000 Encounter for general adult medical examination without abnormal findings: Secondary | ICD-10-CM

## 2014-05-29 NOTE — Progress Notes (Signed)
Pre visit review using our clinic review tool, if applicable. No additional management support is needed unless otherwise documented below in the visit note. 

## 2014-05-29 NOTE — Patient Instructions (Addendum)

## 2014-05-29 NOTE — Progress Notes (Signed)
   Subjective:    Patient ID: Jacob Malone, male    DOB: Jun 02, 1965, 49 y.o.   MRN: [id number]  HPI Patient seen for complete physical. Generally very healthy. He's had a few years now of headaches that followed lumbar puncture several years ago. He has seen multiple neurologists currently [place] [facility]. He is being treated with Lamictal 100 mg daily but has not seen much change. Last tetanus estimated over 10 years ago. Mother's family history unknown. Father with type 2 diabetes. Patient is nonsmoker.  Past Medical History  Diagnosis Date  . Chicken pox   . Iron deficiency anemia   . Hemorrhoids   . Colon polyp     sessile - lost in stool   Past Surgical History  Procedure Laterality Date  . Hemorroidectomy  2011, 2012  . Nasal septum surgery      reports that he has never smoked. He does not have any smokeless tobacco history on file. He reports that he does not drink alcohol or use illicit drugs. family history includes Alcohol abuse in his father; Diabetes in his father. Allergies  Allergen Reactions  . Lansoprazole     "Did not feel right"      Review of Systems  Constitutional: Negative for fever, activity change, appetite change and fatigue.  HENT: Negative for congestion, ear pain and trouble swallowing.   Eyes: Negative for pain and visual disturbance.  Respiratory: Negative for cough, shortness of breath and wheezing.   Cardiovascular: Negative for chest pain and palpitations.  Gastrointestinal: Negative for nausea, vomiting, abdominal pain, diarrhea, constipation, blood in stool, abdominal distention and rectal pain.  Genitourinary: Negative for dysuria, hematuria and testicular pain.  Musculoskeletal: Negative for joint swelling and arthralgias.  Skin: Negative for rash.  Neurological: Positive for headaches. Negative for dizziness, syncope and weakness.  Hematological: Negative for adenopathy.  Psychiatric/Behavioral: Negative for  confusion and dysphoric mood.       Objective:   Physical Exam  Constitutional: He is oriented to person, place, and time. He appears well-developed and well-nourished. No distress.  HENT:  Head: Normocephalic and atraumatic.  Right Ear: External ear normal.  Left Ear: External ear normal.  Mouth/Throat: Oropharynx is clear and moist.  Eyes: Conjunctivae and EOM are normal. Pupils are equal, round, and reactive to light.  Neck: Normal range of motion. Neck supple. No thyromegaly present.  Cardiovascular: Normal rate, regular rhythm and normal heart sounds.   No murmur heard. Pulmonary/Chest: No respiratory distress. He has no wheezes. He has no rales.  Abdominal: Soft. Bowel sounds are normal. He exhibits no distension and no mass. There is no tenderness. There is no rebound and no guarding.  Genitourinary: Rectum normal and prostate normal.  Musculoskeletal: He exhibits no edema.  Lymphadenopathy:    He has no cervical adenopathy.  Neurological: He is alert and oriented to person, place, and time. He displays normal reflexes. No cranial nerve deficit.  Skin: No rash noted.  Psychiatric: He has a normal mood and affect.          Assessment & Plan:  Complete physical.  Labs reviewed.  Tdap given.  He has mild hypertriglyceridemia. Handout on triglycerides given.

## 2014-10-06 ENCOUNTER — Encounter (HOSPITAL_COMMUNITY): Payer: Self-pay | Admitting: Emergency Medicine

## 2014-10-06 ENCOUNTER — Emergency Department (HOSPITAL_COMMUNITY): Payer: Non-veteran care

## 2014-10-06 ENCOUNTER — Emergency Department (HOSPITAL_COMMUNITY)
Admission: EM | Admit: 2014-10-06 | Discharge: 2014-10-07 | Disposition: A | Discharge status: Home / Self Care | Payer: Non-veteran care | Attending: Emergency Medicine | Admitting: Emergency Medicine

## 2014-10-06 DIAGNOSIS — Z79899 Other long term (current) drug therapy: Secondary | ICD-10-CM | POA: Insufficient documentation

## 2014-10-06 DIAGNOSIS — Z86010 Personal history of colonic polyps: Secondary | ICD-10-CM | POA: Diagnosis not present

## 2014-10-06 DIAGNOSIS — Z8719 Personal history of other diseases of the digestive system: Secondary | ICD-10-CM | POA: Insufficient documentation

## 2014-10-06 DIAGNOSIS — R079 Chest pain, unspecified: Secondary | ICD-10-CM | POA: Diagnosis not present

## 2014-10-06 DIAGNOSIS — Z862 Personal history of diseases of the blood and blood-forming organs and certain disorders involving the immune mechanism: Secondary | ICD-10-CM | POA: Insufficient documentation

## 2014-10-06 DIAGNOSIS — Z8619 Personal history of other infectious and parasitic diseases: Secondary | ICD-10-CM | POA: Diagnosis not present

## 2014-10-06 DIAGNOSIS — M549 Dorsalgia, unspecified: Secondary | ICD-10-CM | POA: Diagnosis not present

## 2014-10-06 DIAGNOSIS — R0602 Shortness of breath: Secondary | ICD-10-CM | POA: Diagnosis present

## 2014-10-06 LAB — I-STAT CHEM 8, ED
BUN: 16 mg/dL (ref 6–20)
CALCIUM ION: 1.16 mmol/L (ref 1.12–1.23)
CHLORIDE: 101 mmol/L (ref 101–111)
CREATININE: 1.2 mg/dL (ref 0.61–1.24)
GLUCOSE: 101 mg/dL — AB (ref 65–99)
HCT: 43 % (ref 39.0–52.0)
Hemoglobin: 14.6 g/dL (ref 13.0–17.0)
POTASSIUM: 3.9 mmol/L (ref 3.5–5.1)
Sodium: 141 mmol/L (ref 135–145)
TCO2: 26 mmol/L (ref 0–100)

## 2014-10-06 LAB — CBC WITH DIFFERENTIAL/PLATELET
BASOS ABS: 0 10*3/uL (ref 0.0–0.1)
Basophils Relative: 1 % (ref 0–1)
EOS PCT: 1 % (ref 0–5)
Eosinophils Absolute: 0.1 10*3/uL (ref 0.0–0.7)
HEMATOCRIT: 40.6 % (ref 39.0–52.0)
Hemoglobin: 13.5 g/dL (ref 13.0–17.0)
LYMPHS ABS: 1.6 10*3/uL (ref 0.7–4.0)
LYMPHS PCT: 44 % (ref 12–46)
MCH: 30.2 pg (ref 26.0–34.0)
MCHC: 33.3 g/dL (ref 30.0–36.0)
MCV: 90.8 fL (ref 78.0–100.0)
MONO ABS: 0.4 10*3/uL (ref 0.1–1.0)
Monocytes Relative: 12 % (ref 3–12)
Neutro Abs: 1.5 10*3/uL — ABNORMAL LOW (ref 1.7–7.7)
Neutrophils Relative %: 42 % — ABNORMAL LOW (ref 43–77)
Platelets: 208 10*3/uL (ref 150–400)
RBC: 4.47 MIL/uL (ref 4.22–5.81)
RDW: 12.3 % (ref 11.5–15.5)
WBC: 3.6 10*3/uL — AB (ref 4.0–10.5)

## 2014-10-06 LAB — I-STAT TROPONIN, ED
Troponin i, poc: 0 ng/mL (ref 0.00–0.08)
Troponin i, poc: 0 ng/mL (ref 0.00–0.08)

## 2014-10-06 NOTE — Discharge Instructions (Signed)

## 2014-10-06 NOTE — ED Provider Notes (Signed)
CSN: [id number]     Arrival date & time 10/06/14  2009 History   First MD Initiated Contact with Patient 10/06/14 2120     Chief Complaint  Patient presents with  . Shortness of Breath     (Consider location/radiation/quality/duration/timing/severity/associated sxs/prior Treatment) HPI Comments: 49 year old male presents to the emergency department for further evaluation of chest pain. Patient reports intermittent chest pain over the past 4-5 days. He describes the pain as sharp in nature and states that it has become associated with a sensation of labored breathing which began today. Patient also reports feeling an aching pain across his bilateral upper back with numbness and tingling in his left arm. Patient denies taking any medications for symptoms. He denies any modifying factors of his symptoms as well. Patient denies a history of diabetes, hypertension, hyperlipidemia, ACS, or DVT/PE. He has had no associated fevers, syncope, near syncope, nausea, vomiting, abdominal pain, extremity weakness, or leg swelling. Patient denies any known family history of ACS or DVT/PE. He states that he followed up with his primary doctor at the [facility] yesterday for routine care and was "told not to worry" about his symptoms. No recent surgeries/hospitalizations or prolonged travel, per patient.+LUE  Patient is a 49 y.o. male presenting with shortness of breath. The history is provided by the patient. No language interpreter was used.  Shortness of Breath Associated symptoms: chest pain     Past Medical History  Diagnosis Date  . Chicken pox   . Iron deficiency anemia   . Hemorrhoids   . Colon polyp     sessile - lost in stool   Past Surgical History  Procedure Laterality Date  . Hemorroidectomy  2011, 2012  . Nasal septum surgery     Family History  Problem Relation Age of Onset  . Alcohol abuse Father   . Diabetes Father    Social History  Substance Use Topics  . Smoking status: Never  Smoker   . Smokeless tobacco: None  . Alcohol Use: No    Review of Systems  Respiratory: Positive for shortness of breath.   Cardiovascular: Positive for chest pain.  Musculoskeletal: Positive for back pain.  Neurological:       +LUE paresthesias  All other systems reviewed and are negative.   Allergies  Lansoprazole  Home Medications   Prior to Admission medications   Medication Sig Start Date End Date Taking? Authorizing Provider  Coenzyme Q10 (COQ10 PO) Take 1 tablet by mouth daily.    Yes Historical Provider, MD  lamoTRIgine (LAMICTAL) 100 MG tablet Take 100 mg by mouth daily. 04/22/14  Yes Historical Provider, MD  MAGNESIUM LACTATE PO Take 84 mg by mouth 2 (two) times daily.   Yes Historical Provider, MD   BP 111/71 mmHg  Pulse 59  Temp(Src) 97.6 F (36.4 C) (Oral)  Resp 11  Ht 5\' 11"  (1.803 m)  Wt 160 lb (72.576 kg)  BMI 22.33 kg/m2  SpO2 100%   Physical Exam  Constitutional: He is oriented to person, place, and time. He appears well-developed and well-nourished. No distress.  HENT:  Head: Normocephalic and atraumatic.  Eyes: Conjunctivae and EOM are normal. No scleral icterus.  Neck: Normal range of motion.  No JVD  Cardiovascular: Normal rate, regular rhythm and intact distal pulses.   Pulmonary/Chest: Effort normal and breath sounds normal. No respiratory distress. He has no wheezes. He has no rales.  Lungs CTAB. Respirations even and unlabored.  Abdominal: Soft. He exhibits no distension. There is  no tenderness. There is no rebound and no guarding.  Musculoskeletal: Normal range of motion.  No pitting edema in b/l lower extremities.  Neurological: He is alert and oriented to person, place, and time. He exhibits normal muscle tone. Coordination normal.  Skin: Skin is warm and dry. No rash noted. He is not diaphoretic. No erythema. No pallor.  Psychiatric: He has a normal mood and affect. His behavior is normal.  Nursing note and vitals reviewed.   ED  Course  Procedures (including critical care time) Labs Review Labs Reviewed  CBC WITH DIFFERENTIAL/PLATELET - Abnormal; Notable for the following:    WBC 3.6 (*)    Neutrophils Relative % 42 (*)    Neutro Abs 1.5 (*)    All other components within normal limits  I-STAT CHEM 8, ED - Abnormal; Notable for the following:    Glucose, Bld 101 (*)    All other components within normal limits  I-STAT TROPOININ, ED  I-STAT TROPOININ, ED    Imaging Review Dg Chest 2 View  10/06/2014   CLINICAL DATA:  Intermittent chest pain for 5 days.  EXAM: CHEST  2 VIEW  COMPARISON:  January 03, 2010  FINDINGS: The heart size and mediastinal contours are within normal limits. There is no focal infiltrate, pulmonary edema, or pleural effusion. The visualized skeletal structures are unremarkable.  IMPRESSION: No active cardiopulmonary disease.   Electronically Signed   By: Sherian Rein M.D.   On: 10/06/2014 20:44   I have personally reviewed and evaluated these images and lab results as part of my medical decision-making.   EKG Interpretation   Date/Time:  Tuesday October 06 2014 20:14:26 EDT Ventricular Rate:  65 PR Interval:  156 QRS Duration: 84 QT Interval:  386 QTC Calculation: 401 R Axis:   83 Text Interpretation:  Normal sinus rhythm Normal ECG No significant change  since last tracing Confirmed by YAO  MD, DAVID ([id number]) on 10/07/2014  12:08:53 AM      2145 - Plan includes delta troponin at 2300. Heart score is 2 (age and suspicion) c/w low risk of MACE. Patient is low risk for PE; PERC negative. No symptoms at this time, per patient. MDM   Final diagnoses:  Chest pain, unspecified chest pain type    49 year old male presents to the emergency department for further evaluation of chest pain. Patient has no risk factors for heart disease. Pain has been intermittent over the last 4-5 days. Patient has a nonischemic EKG today. Troponin negative 2. Doubt ACS. Chest x-ray shows no evidence of  pneumothorax or pneumonia. No mediastinal widening; doubt dissection. Also doubt PE. Patient is at low risk for this.   Given patient's reassuring workup, do not believe further emergent testing is indicated at this time. Patient will be discharged with instruction of follow-up with a cardiologist on an outpatient basis as well as his primary care doctor for further evaluation of symptoms. Return precautions discussed and provided. Patient agreeable to plan with no unaddressed concerns. Patient discharged in good condition; VSS.   Filed Vitals:   10/06/14 2145 10/06/14 2200 10/06/14 2230 10/06/14 2245  BP: 113/70 104/76 113/72 111/71  Pulse: 59 64 60 59  Temp:      TempSrc:      Resp: Height:      Weight:      SpO2: 100% 100% 100% 100%     Antony Madura, PA-C 10/07/14 0010  Richardean Canal, MD 10/07/14 [id number]

## 2014-10-06 NOTE — ED Notes (Signed)
Pt st's he has had stabbing chest pain off and on for several days.  St's today he has felt short of breath.  Pt also st's he started having pain in left arm radiating down to hand earlier today

## 2014-10-07 NOTE — ED Notes (Signed)
Pt left with all belongings and ambulated out of the treatment area.  

## 2015-01-20 DIAGNOSIS — L819 Disorder of pigmentation, unspecified: Secondary | ICD-10-CM | POA: Insufficient documentation

## 2015-07-09 ENCOUNTER — Encounter: Payer: Self-pay | Admitting: Family Medicine

## 2015-07-09 ENCOUNTER — Ambulatory Visit (INDEPENDENT_AMBULATORY_CARE_PROVIDER_SITE_OTHER): Payer: BLUE CROSS/BLUE SHIELD | Admitting: Family Medicine

## 2015-07-09 VITALS — BP 108/88 | HR 86 | Temp 98.3°F | Ht 71.0 in | Wt 162.0 lb

## 2015-07-09 DIAGNOSIS — M5412 Radiculopathy, cervical region: Secondary | ICD-10-CM

## 2015-07-09 MED ORDER — CYCLOBENZAPRINE HCL 10 MG PO TABS: 10.0000 mg | ORAL_TABLET | Freq: Three times a day (TID) | ORAL | Status: DC | PRN

## 2015-07-09 NOTE — Progress Notes (Signed)
   Subjective:    Patient ID: Jacob Malone, male    DOB: 1965-11-04, 50 y.o.   MRN: [id number]  HPI Patient seen with left neck and left upper extremity pain. Onset about a week ago. He's had similar problems intermittently he states for 20 years. No recent injury. Intermittent sharp pain which radiates from the base of the cervical spine to the left hand. No numbness or weakness. 8 out of 10 severity at its worst. Worse with movement. Took ibuprofen without improvement. He states he has taken Flexeril in the past which seemed to help.  Past Medical History  Diagnosis Date  . Chicken pox   . Iron deficiency anemia   . Hemorrhoids   . Colon polyp     sessile - lost in stool   Past Surgical History  Procedure Laterality Date  . Hemorroidectomy  2011, 2012  . Nasal septum surgery      reports that he has never smoked. He does not have any smokeless tobacco history on file. He reports that he does not drink alcohol or use illicit drugs. family history includes Alcohol abuse in his father; Diabetes in his father. Allergies  Allergen Reactions  . Lansoprazole     "Did not feel right"      Review of Systems  Constitutional: Negative for appetite change and unexpected weight change.  Cardiovascular: Negative for chest pain.  Neurological: Negative for weakness and numbness.       Objective:   Physical Exam  Constitutional: He appears well-developed and well-nourished.  Neck: Neck supple.  Cardiovascular: Normal rate and regular rhythm.   Pulmonary/Chest: Effort normal and breath sounds normal. No respiratory distress. He has no wheezes. He has no rales.  Musculoskeletal: He exhibits no edema.  Neurological:  Full strength upper extremities. Symmetric reflexes. Normal sensory function.          Assessment & Plan:  Left cervical neck pain with left upper extremity radiculitis symptoms with nonfocal neurologic exam. We refilled Flexeril to use as needed. Continue  ibuprofen. We discussed possible prednisone taper but he is reluctant for concern of side effects. Observe for now. Follow-up promptly for any numbness or weakness or any progressive symptoms. Also be in touch if symptoms not improving over the next few weeks.  Kristian CoveyBruce W Burchette MD [facility]

## 2015-07-09 NOTE — Progress Notes (Signed)
Pre visit review using our clinic review tool, if applicable. No additional management support is needed unless otherwise documented below in the visit note. 

## 2015-07-09 NOTE — Patient Instructions (Signed)

## 2015-09-27 ENCOUNTER — Encounter: Payer: Self-pay | Admitting: Family Medicine

## 2015-09-27 ENCOUNTER — Ambulatory Visit (INDEPENDENT_AMBULATORY_CARE_PROVIDER_SITE_OTHER): Payer: BLUE CROSS/BLUE SHIELD | Admitting: Family Medicine

## 2015-09-27 VITALS — BP 100/80 | HR 81 | Temp 97.9°F | Ht 71.0 in | Wt 166.0 lb

## 2015-09-27 DIAGNOSIS — R0789 Other chest pain: Secondary | ICD-10-CM

## 2015-09-27 NOTE — Progress Notes (Signed)
Subjective:     Patient ID: Jacob Malone, male   DOB: 10/09/1965, 50 y.o.   MRN: [id number]  HPI Patient seen for evaluation of recent chest pain. About one week ago onset of intermittent episodes of bilateral anterior chest pain which sometimes last for several hours. Sharp to achy quality. No exacerbating factors. No associated dyspnea, nausea, vomiting. Nonexertional. No recent travels. No family history of premature CAD. Patient is fairly low risk overall. Nonsmoker. Mild dyslipidemia. No history of diabetes.  Patient had workup per cardiology 2012 with echocardiogram and exercise tolerance test which were unremarkable-those were reviewed today. No recent cough. No fevers or chills.  Past Medical History:  Diagnosis Date  . Chicken pox   . Colon polyp    sessile - lost in stool  . Hemorrhoids   . Iron deficiency anemia    Past Surgical History:  Procedure Laterality Date  . HEMORROIDECTOMY  2011, 2012  . NASAL SEPTUM SURGERY      reports that he has never smoked. He does not have any smokeless tobacco history on file. He reports that he does not drink alcohol or use drugs. family history includes Alcohol abuse in his father; Diabetes in his father. Allergies  Allergen Reactions  . Lansoprazole     "Did not feel right"     Review of Systems  Constitutional: Negative for chills and fever.  Respiratory: Negative for cough and shortness of breath.   Cardiovascular: Positive for chest pain. Negative for palpitations and leg swelling.  Gastrointestinal: Negative for abdominal pain, nausea and vomiting.  Neurological: Negative for dizziness and syncope.       Objective:   Physical Exam  Constitutional: He is oriented to person, place, and time. He appears well-developed and well-nourished. No distress.  Neck: Neck supple. No thyromegaly present.  Cardiovascular: Normal rate and regular rhythm.  Exam reveals no gallop.   No murmur heard. Pulmonary/Chest: Effort normal and  breath sounds normal. No respiratory distress. He has no wheezes. He has no rales.  Musculoskeletal: He exhibits no edema.  Neurological: He is alert and oriented to person, place, and time.       Assessment:     Atypical chest pain. He's had since history of similar atypical symptoms in the past EKG shows normal sinus rhythm with no acute changes Question GERD related.    Plan:     -Handout given on GERD -Try over-the-counter Zantac or Pepcid -Follow-up promptly for any exertional chest pains or new symptoms -Schedule complete physical  Kristian CoveyBruce W Irine Heminger MD [facility]

## 2015-09-27 NOTE — Patient Instructions (Signed)
Food Choices for Gastroesophageal Reflux Disease, Adult When you have gastroesophageal reflux disease (GERD), the foods you eat and your eating habits are very important. Choosing the right foods can help ease the discomfort of GERD. WHAT GENERAL GUIDELINES DO I NEED TO FOLLOW?  Choose fruits, vegetables, whole grains, low-fat dairy products, and low-fat meat, fish, and poultry.  Limit fats such as oils, salad dressings, butter, nuts, and avocado.  Keep a food diary to identify foods that cause symptoms.  Avoid foods that cause reflux. These may be different for different people.  Eat frequent small meals instead of three large meals each day.  Eat your meals slowly, in a relaxed setting.  Limit fried foods.  Cook foods using methods other than frying.  Avoid drinking alcohol.  Avoid drinking large amounts of liquids with your meals.  Avoid bending over or lying down until 2-3 hours after eating. WHAT FOODS ARE NOT RECOMMENDED? The following are some foods and drinks that may worsen your symptoms: Vegetables Tomatoes. Tomato juice. Tomato and spaghetti sauce. Chili peppers. Onion and garlic. Horseradish. Fruits Oranges, grapefruit, and lemon (fruit and juice). Meats High-fat meats, fish, and poultry. This includes hot dogs, ribs, ham, sausage, salami, and bacon. Dairy Whole milk and chocolate milk. Sour cream. Cream. Butter. Ice cream. Cream cheese.  Beverages Coffee and tea, with or without caffeine. Carbonated beverages or energy drinks. Condiments Hot sauce. Barbecue sauce.  Sweets/Desserts Chocolate and cocoa. Donuts. Peppermint and spearmint. Fats and Oils High-fat foods, including Jamaica[place] fries and potato chips. Other Vinegar. Strong spices, such as black pepper, white pepper, red pepper, cayenne, curry powder, cloves, ginger, and chili powder. The items listed above may not be a complete list of foods and beverages to avoid. Contact your dietitian for more  information.   This information is not intended to replace advice given to you by your health care provider. Make sure you discuss any questions you have with your health care provider.   Document Released: 01/23/2005 Document Revised: 02/13/2014 Document Reviewed: 11/27/2012 Elsevier Interactive Patient Education 2016 [employer]  Consider OTC TUMS, Zantac, or Pepcid Let me know if not relieved with that.

## 2015-09-27 NOTE — Progress Notes (Signed)
Pre visit review using our clinic review tool, if applicable. No additional management support is needed unless otherwise documented below in the visit note. 

## 2015-10-12 ENCOUNTER — Ambulatory Visit: Payer: BLUE CROSS/BLUE SHIELD | Admitting: Family Medicine

## 2015-10-12 ENCOUNTER — Emergency Department (HOSPITAL_COMMUNITY)
Admission: EM | Admit: 2015-10-12 | Discharge: 2015-10-12 | Disposition: A | Discharge status: Home / Self Care | Payer: BLUE CROSS/BLUE SHIELD | Attending: Emergency Medicine | Admitting: Emergency Medicine

## 2015-10-12 ENCOUNTER — Telehealth: Payer: Self-pay | Admitting: Family Medicine

## 2015-10-12 ENCOUNTER — Encounter (HOSPITAL_COMMUNITY): Payer: Self-pay

## 2015-10-12 DIAGNOSIS — K5900 Constipation, unspecified: Secondary | ICD-10-CM | POA: Diagnosis not present

## 2015-10-12 DIAGNOSIS — R109 Unspecified abdominal pain: Secondary | ICD-10-CM

## 2015-10-12 DIAGNOSIS — R55 Syncope and collapse: Secondary | ICD-10-CM | POA: Insufficient documentation

## 2015-10-12 DIAGNOSIS — G4489 Other headache syndrome: Secondary | ICD-10-CM | POA: Diagnosis not present

## 2015-10-12 HISTORY — DX: Dizziness and giddiness: R42

## 2015-10-12 HISTORY — DX: Headache: R51

## 2015-10-12 LAB — BASIC METABOLIC PANEL
Anion gap: 6 (ref 5–15)
BUN: 12 mg/dL (ref 6–20)
CHLORIDE: 102 mmol/L (ref 101–111)
CO2: 28 mmol/L (ref 22–32)
CREATININE: 1.19 mg/dL (ref 0.61–1.24)
Calcium: 9.2 mg/dL (ref 8.9–10.3)
Glucose, Bld: 108 mg/dL — ABNORMAL HIGH (ref 65–99)
POTASSIUM: 4.2 mmol/L (ref 3.5–5.1)
Sodium: 136 mmol/L (ref 135–145)

## 2015-10-12 LAB — CBC
HCT: 39.6 % (ref 39.0–52.0)
Hemoglobin: 13.2 g/dL (ref 13.0–17.0)
MCH: 30.2 pg (ref 26.0–34.0)
MCHC: 33.3 g/dL (ref 30.0–36.0)
MCV: 90.6 fL (ref 78.0–100.0)
PLATELETS: 182 10*3/uL (ref 150–400)
RBC: 4.37 MIL/uL (ref 4.22–5.81)
RDW: 12.2 % (ref 11.5–15.5)
WBC: 10 10*3/uL (ref 4.0–10.5)

## 2015-10-12 LAB — URINALYSIS, ROUTINE W REFLEX MICROSCOPIC
Bilirubin Urine: NEGATIVE
GLUCOSE, UA: NEGATIVE mg/dL
Hgb urine dipstick: NEGATIVE
KETONES UR: NEGATIVE mg/dL
LEUKOCYTES UA: NEGATIVE
NITRITE: NEGATIVE
PROTEIN: NEGATIVE mg/dL
Specific Gravity, Urine: 1.019 (ref 1.005–1.030)
pH: 5.5 (ref 5.0–8.0)

## 2015-10-12 LAB — CBG MONITORING, ED: GLUCOSE-CAPILLARY: 98 mg/dL (ref 65–99)

## 2015-10-12 NOTE — Telephone Encounter (Signed)
Patient checked into ED 

## 2015-10-12 NOTE — ED Provider Notes (Signed)
[facility]-EMERGENCY DEPT Provider Note   CSN: [id number] Arrival date & time: 10/12/15  1419     History   Chief Complaint Chief Complaint  Patient presents with  . Loss of Consciousness  . Abdominal Pain    HPI Jacob Malone is a 50 y.o. male.  He presents for evaluation of a syncopal episode preceded by feeling flushed, dizzy, and having abdominal pain. He does not feel he injured himself. Since that time, about 7 AM, he has had a headache. His abdominal pain has almost resolved. He began having constipation 3 days ago and had a bowel movement yesterday. He continued to have abdominal cramping after the bowel movement. He denies fever, chills, nausea or vomiting. He feels some indigestion today. He has not yet eaten today. He has had chronic headaches for 4 years, every day, 5/10. Now his headache is intermittent. He has a rescue medicine for pain, an anti-inflammatory at home. There are no other known modifying factors.   HPI  Past Medical History:  Diagnosis Date  . Chicken pox   . Colon polyp    sessile - lost in stool  . Headache, chronic daily 2013   d/t lumbar puncture   . Hemorrhoids   . Iron deficiency anemia   . Migrainous dizziness 2012    Patient Active Problem List   Diagnosis Date Noted  . RLQ abdominal pain 05/06/2014  . FOOT PAIN, BILATERAL 08/21/2008    Past Surgical History:  Procedure Laterality Date  . HEMORROIDECTOMY  2011, 2012  . NASAL SEPTUM SURGERY         Home Medications    Prior to Admission medications   Medication Sig Start Date End Date Taking? Authorizing Provider  Coenzyme Q10 (COQ10) 400 MG CAPS Take 400 mg by mouth daily.   Yes Historical Provider, MD  Cyanocobalamin (B-12 PO) Take 1,875 mcg by mouth daily.    Yes Historical Provider, MD  cyclobenzaprine (FLEXERIL) 10 MG tablet Take 1 tablet (10 mg total) by mouth 3 (three) times daily as needed for muscle spasms. 07/09/15  Yes Kristian CoveyBruce W Burchette, MD  docusate sodium (COLACE) 100 MG  capsule Take 100 mg by mouth daily as needed for mild constipation.   Yes Historical Provider, MD  lamoTRIgine (LAMICTAL) 100 MG tablet Take 100 mg by mouth daily. 04/22/14  Yes Historical Provider, MD  MAGNESIUM LACTATE PO Take 84 mg by mouth every evening.    Yes Historical Provider, MD  NON FORMULARY Take 0.5 tablets by mouth daily. Folate, methylated b12, glucosamine complex   Yes Historical Provider, MD  Riboflavin (VITAMIN B2 PO) Take 400 mg by mouth daily.    Yes Historical Provider, MD    Family History Family History  Problem Relation Age of Onset  . Alcohol abuse Father   . Diabetes Father     Social History Social History  Substance Use Topics  . Smoking status: Never Smoker  . Smokeless tobacco: Never Used  . Alcohol use No     Allergies   Lansoprazole   Review of Systems Review of Systems  All other systems reviewed and are negative.    Physical Exam Updated Vital Signs BP 112/76   Pulse 77   Temp 98.4 F (36.9 C) (Oral)   Resp 13   SpO2 100%   Physical Exam  Constitutional: He is oriented to person, place, and time. He appears well-developed and well-nourished.  HENT:  Head: Normocephalic and atraumatic.  Right Ear: External ear normal.  Left  Ear: External ear normal.  Eyes: Conjunctivae and EOM are normal. Pupils are equal, round, and reactive to light.  Neck: Normal range of motion and phonation normal. Neck supple.  Cardiovascular: Normal rate, regular rhythm and normal heart sounds.   Pulmonary/Chest: Effort normal and breath sounds normal. He exhibits no bony tenderness.  Abdominal: Soft. Distention: .ewed. There is no tenderness.  Musculoskeletal: Normal range of motion.  Neurological: He is alert and oriented to person, place, and time. No cranial nerve deficit or sensory deficit. He exhibits normal muscle tone. Coordination normal.  No dysarthria and aphasia or nystagmus  Skin: Skin is warm, dry and intact. No rash noted.  Psychiatric: He  has a normal mood and affect. His behavior is normal. Judgment and thought content normal.  Nursing note and vitals reviewed.    ED Treatments / Results  Labs (all labs ordered are listed, but only abnormal results are displayed) Labs Reviewed  BASIC METABOLIC PANEL - Abnormal; Notable for the following:       Result Value   Glucose, Bld 108 (*)    All other components within normal limits  URINALYSIS, ROUTINE W REFLEX MICROSCOPIC (NOT AT [facility]) - Abnormal; Notable for the following:    APPearance CLOUDY (*)    All other components within normal limits  CBC  CBG MONITORING, ED    EKG  EKG Interpretation  Date/Time:  Tuesday October 12 2015 14:39:21 EDT Ventricular Rate:  87 PR Interval:  150 QRS Duration: 86 QT Interval:  356 QTC Calculation: 428 R Axis:   86 Text Interpretation:  Normal sinus rhythm Normal ECG since last tracing no significant change Confirmed by Effie Shy  MD, Peni Rupard ([id number]) on 10/12/2015 8:28:00 PM       Radiology No results found.  Procedures Procedures (including critical care time)  Medications Ordered in ED Medications - No data to display   Initial Impression / Assessment and Plan / ED Course  I have reviewed the triage vital signs and the nursing notes.  Pertinent labs & imaging results that were available during my care of the patient were reviewed by me and considered in my medical decision making (see chart for details).  Clinical Course   Medications - No data to display  Patient Vitals for the past 24 hrs:  BP Temp Temp src Pulse Resp SpO2  10/12/15 2145 119/79 - - 70 17 99 %  10/12/15 2130 116/80 - - 66 15 100 %  10/12/15 2115 113/74 - - 65 17 100 %  10/12/15 2100 114/77 - - 69 16 99 %  10/12/15 2045 115/75 - - 68 16 100 %  10/12/15 2030 112/76 - - 77 13 100 %  10/12/15 2021 116/76 - - 77 22 100 %  10/12/15 1726 110/70 98.4 F (36.9 C) Oral 81 14 99 %    At discharge- Reevaluation with update and discussion. After initial  assessment and treatment, an updated evaluation reveals he remains comfortable, no further complaints. Findings discussed with patient and all questions answered. Hamid Brookens L    Final Clinical Impressions(s) / ED Diagnoses   Final diagnoses:  Other headache syndrome  Abdominal pain, unspecified abdominal location  Syncope, unspecified syncope type  Constipation, unspecified constipation type    Obstipation with decreased oral intake, likely from dehydration and subsequent syncope. Doubt serious injury, metabolic instability or impending vascular collapse.  Nursing Notes Reviewed/ Care Coordinated Applicable Imaging Reviewed Interpretation of Laboratory Data incorporated into ED treatment  The patient appears reasonably screened and/or  stabilized for discharge and I doubt any other medical condition or other [facility] requiring further screening, evaluation, or treatment in the ED at this time prior to discharge.  Plan: Home Medications- continue stool softeners; Home Treatments- increase fluid and fiber intake; return here if the recommended treatment, does not improve the symptoms; Recommended follow up- PCP checkup one or 2 weeks     New Prescriptions New Prescriptions   No medications on file     Mancel Bale, MD 10/13/15 0105

## 2015-10-12 NOTE — Discharge Instructions (Signed)
Continue taking Colace for constipation.  Drink plenty of fluids, water and juice. Keep your fiber content high.  Return here or see your doctor, if needed, for problems.

## 2015-10-12 NOTE — ED Triage Notes (Signed)
Onset early yesterday am pt took stool softener for constipation.  At 6pm yesterday pt started having abd pain, had BM. Pt still had abd pain throughout night and today 11:30am syncope, unknown down time.  NO injuries.

## 2015-10-12 NOTE — Telephone Encounter (Signed)
Patient Name: Jacob Malone DOB: 1965-05-17 Initial Comment Caller states he started having severe abdominal pain last night. Has been constipated a couple of days. Had a small BM. He passed out over a hour ago. Nurse Assessment Nurse: Roma KayserForsythe, RN, Santina Evansatherine Date/Time (Eastern Time): 10/12/2015 12:54:03 PM Confirm and document reason for call. If symptomatic, describe symptoms. You must click the next button to save text entered. ---caller states he started having abd pain last night, has been constipated. today passed out, woke up on the floor, not sure how long he was out, happened about an hour ago, pain does seem better. no fever Has the patient traveled out of the country within the last 30 days? ---Not Applicable Does the patient have any new or worsening symptoms? ---Yes Will a triage be completed? ---Yes Related visit to physician within the last 2 weeks? ---N/A Does the PT have any chronic conditions? (i.e. diabetes, asthma, etc.) ---No Is this a behavioral health or substance abuse call? ---No Guidelines Guideline Title Affirmed Question Affirmed Notes Abdominal Pain - Male Patient sounds very sick or weak to the triager Final Disposition User Go to ED Now (or PCP triage) Roma KayserForsythe, RN, Santina Evansatherine Comments ED for syncopal episode [facility] Disagree/Comply: Comply

## 2015-10-14 ENCOUNTER — Encounter: Payer: Self-pay | Admitting: Family Medicine

## 2015-10-14 ENCOUNTER — Ambulatory Visit (INDEPENDENT_AMBULATORY_CARE_PROVIDER_SITE_OTHER): Payer: BLUE CROSS/BLUE SHIELD | Admitting: Family Medicine

## 2015-10-14 VITALS — BP 102/80 | HR 76 | Temp 97.8°F | Ht 71.0 in | Wt 158.0 lb

## 2015-10-14 DIAGNOSIS — R0989 Other specified symptoms and signs involving the circulatory and respiratory systems: Secondary | ICD-10-CM

## 2015-10-14 DIAGNOSIS — J069 Acute upper respiratory infection, unspecified: Secondary | ICD-10-CM | POA: Diagnosis not present

## 2015-10-14 DIAGNOSIS — R591 Generalized enlarged lymph nodes: Secondary | ICD-10-CM

## 2015-10-14 NOTE — Progress Notes (Signed)
HPI:  Jacob Malone is a pleasant 50 yo here for an acute visit for:  Throat soreness: -started today -symptoms: R lymph node soreness, hoarseness, drainage -no strep exposure, fevers, NVD, HA, neck pain otherwise -in ER a few days go for eval syncope thought secondary to cramping abd pain with constipation - he has not had any syncopal or presyncopal symptoms since, CP, SOB, vision changes, weakness, numbness or headache  ROS: See pertinent positives and negatives per HPI.  Past Medical History:  Diagnosis Date  . Chicken pox   . Colon polyp    sessile - lost in stool  . Headache, chronic daily 2013   d/t lumbar puncture   . Hemorrhoids   . Iron deficiency anemia   . Migrainous dizziness 2012    Past Surgical History:  Procedure Laterality Date  . HEMORROIDECTOMY  2011, 2012  . NASAL SEPTUM SURGERY      Family History  Problem Relation Age of Onset  . Alcohol abuse Father   . Diabetes Father     Social History   Social History  . Marital status: Single    Spouse name: N/A  . Number of children: N/A  . Years of education: N/A   Social History Main Topics  . Smoking status: Never Smoker  . Smokeless tobacco: Never Used  . Alcohol use No  . Drug use: No  . Sexual activity: Not Asked   Other Topics Concern  . None   Social History Narrative  . None     Current Outpatient Prescriptions:  .  Coenzyme Q10 (COQ10) 400 MG CAPS, Take 400 mg by mouth daily., Disp: , Rfl:  .  Cyanocobalamin (B-12 PO), Take 1,875 mcg by mouth daily. , Disp: , Rfl:  .  cyclobenzaprine (FLEXERIL) 10 MG tablet, Take 1 tablet (10 mg total) by mouth 3 (three) times daily as needed for muscle spasms., Disp: 30 tablet, Rfl: 0 .  docusate sodium (COLACE) 100 MG capsule, Take 100 mg by mouth daily as needed for mild constipation., Disp: , Rfl:  .  lamoTRIgine (LAMICTAL) 100 MG tablet, Take 100 mg by mouth daily., Disp: , Rfl: 1 .  MAGNESIUM LACTATE PO, Take 84 mg by mouth every evening.  , Disp: , Rfl:  .  NON FORMULARY, Take 0.5 tablets by mouth daily. Folate, methylated b12, glucosamine complex, Disp: , Rfl:  .  Riboflavin (VITAMIN B2 PO), Take 400 mg by mouth daily. , Disp: , Rfl:   EXAM:  Vitals:   10/14/15 1258  BP: 102/80  Pulse: 76  Temp: 97.8 F (36.6 C)    Body mass index is 22.04 kg/m.  GENERAL: vitals reviewed and listed above, alert, oriented, appears well hydrated and in no acute distress  HEENT: atraumatic, conjunttiva clear, no obvious abnormalities on inspection of external nose and ears, normal appearance of ear canals and TMs, clear nasal congestion, mild post oropharyngeal erythema with PND, no tonsillar edema or exudate, no sinus TTP  NECK: no obvious masses on inspection, no bony TTP, no meningeal signs, he does have some R tender shotty ant cervical lymphadenopathy - no sig enlarged nodes  LUNGS: clear to auscultation bilaterally, no wheezes, rales or rhonchi, good air movement  CV: HRRR, no peripheral edema  MS: moves all extremities without noticeable abnormality  PSYCH: pleasant and cooperative, no obvious depression or anxiety  ASSESSMENT AND PLAN:  Discussed the following assessment and plan:  Viral URI  Tender lymph node  -we discussed possible serious and  likely etiologies, workup and treatment, treatment risks and return precautions -suspect viral illness, mild - symptomatic/supportive care measures discussed -he plans to follow up with PCP in a few weeks -Patient advised to return or notify a doctor sooner if symptoms worsen or persist or new concerns arise.  Declined AVS  There are no Patient Instructions on file for this visit.  Kriste Basque R., DO

## 2015-11-01 ENCOUNTER — Other Ambulatory Visit: Payer: Self-pay

## 2015-11-01 DIAGNOSIS — E538 Deficiency of other specified B group vitamins: Secondary | ICD-10-CM

## 2015-11-01 DIAGNOSIS — E8889 Other specified metabolic disorders: Secondary | ICD-10-CM

## 2015-11-02 ENCOUNTER — Other Ambulatory Visit (INDEPENDENT_AMBULATORY_CARE_PROVIDER_SITE_OTHER): Payer: BLUE CROSS/BLUE SHIELD

## 2015-11-02 DIAGNOSIS — Z0000 Encounter for general adult medical examination without abnormal findings: Secondary | ICD-10-CM

## 2015-11-02 DIAGNOSIS — E8889 Other specified metabolic disorders: Secondary | ICD-10-CM

## 2015-11-02 DIAGNOSIS — E728 Other specified disorders of amino-acid metabolism: Secondary | ICD-10-CM

## 2015-11-02 DIAGNOSIS — E538 Deficiency of other specified B group vitamins: Secondary | ICD-10-CM | POA: Diagnosis not present

## 2015-11-02 LAB — HEPATIC FUNCTION PANEL
ALBUMIN: 4.4 g/dL (ref 3.5–5.2)
ALT: 21 U/L (ref 0–53)
AST: 19 U/L (ref 0–37)
Alkaline Phosphatase: 58 U/L (ref 39–117)
Bilirubin, Direct: 0.1 mg/dL (ref 0.0–0.3)
TOTAL PROTEIN: 7.2 g/dL (ref 6.0–8.3)
Total Bilirubin: 0.5 mg/dL (ref 0.2–1.2)

## 2015-11-02 LAB — CBC WITH DIFFERENTIAL/PLATELET
BASOS PCT: 0.8 % (ref 0.0–3.0)
Basophils Absolute: 0 10*3/uL (ref 0.0–0.1)
EOS PCT: 1.1 % (ref 0.0–5.0)
Eosinophils Absolute: 0 10*3/uL (ref 0.0–0.7)
HEMATOCRIT: 39.6 % (ref 39.0–52.0)
HEMOGLOBIN: 13.6 g/dL (ref 13.0–17.0)
LYMPHS PCT: 40.8 % (ref 12.0–46.0)
Lymphs Abs: 1.3 10*3/uL (ref 0.7–4.0)
MCHC: 34.2 g/dL (ref 30.0–36.0)
MCV: 89.4 fl (ref 78.0–100.0)
MONOS PCT: 11.3 % (ref 3.0–12.0)
Monocytes Absolute: 0.4 10*3/uL (ref 0.1–1.0)
NEUTROS ABS: 1.4 10*3/uL (ref 1.4–7.7)
Neutrophils Relative %: 46 % (ref 43.0–77.0)
PLATELETS: 251 10*3/uL (ref 150.0–400.0)
RBC: 4.43 Mil/uL (ref 4.22–5.81)
RDW: 13 % (ref 11.5–15.5)
WBC: 3.1 10*3/uL — ABNORMAL LOW (ref 4.0–10.5)

## 2015-11-02 LAB — LIPID PANEL
Cholesterol: 188 mg/dL (ref 0–200)
HDL: 50.6 mg/dL (ref >39.00)
LDL Cholesterol: 112 mg/dL — ABNORMAL HIGH (ref 0–99)
NonHDL: 137.29
TRIGLYCERIDES: 128 mg/dL (ref 0.0–149.0)
Total CHOL/HDL Ratio: 4
VLDL: 25.6 mg/dL (ref 0.0–40.0)

## 2015-11-02 LAB — BASIC METABOLIC PANEL
BUN: 15 mg/dL (ref 6–23)
CHLORIDE: 102 meq/L (ref 96–112)
CO2: 32 meq/L (ref 19–32)
CREATININE: 1.06 mg/dL (ref 0.40–1.50)
Calcium: 9.2 mg/dL (ref 8.4–10.5)
GFR: 79.3 mL/min (ref >60.00)
Glucose, Bld: 87 mg/dL (ref 70–99)
POTASSIUM: 4.2 meq/L (ref 3.5–5.1)
Sodium: 139 mEq/L (ref 135–145)

## 2015-11-02 LAB — FOLATE

## 2015-11-02 LAB — VITAMIN B12: VITAMIN B 12: 1050 pg/mL — AB (ref 211–911)

## 2015-11-02 LAB — PSA: PSA: 0.67 ng/mL (ref 0.10–4.00)

## 2015-11-02 LAB — TSH: TSH: 2.17 u[IU]/mL (ref 0.35–4.50)

## 2015-11-03 LAB — HOMOCYSTEINE: Homocysteine: 10.6 umol/L (ref <11.4)

## 2015-11-05 LAB — METHYLMALONIC ACID, SERUM: Methylmalonic Acid, Quant: 107 nmol/L (ref 87–318)

## 2015-11-09 ENCOUNTER — Encounter: Payer: Self-pay | Admitting: Family Medicine

## 2015-11-09 ENCOUNTER — Ambulatory Visit (INDEPENDENT_AMBULATORY_CARE_PROVIDER_SITE_OTHER): Payer: BLUE CROSS/BLUE SHIELD | Admitting: Family Medicine

## 2015-11-09 VITALS — BP 100/80 | HR 102 | Temp 98.1°F | Ht 71.0 in | Wt 164.0 lb

## 2015-11-09 DIAGNOSIS — Z0000 Encounter for general adult medical examination without abnormal findings: Secondary | ICD-10-CM | POA: Diagnosis not present

## 2015-11-09 NOTE — Progress Notes (Signed)
Subjective:     Patient ID: Jacob Malone, male   DOB: March 20, 1965, 50 y.o.   MRN: [id number]  HPI Patient here for complete physical He has long history of headaches following lumbar puncture procedure 2013 and is followed closely by neurology for that. He also has had some chronic dizziness associated with that and is being maintained on Lamictal. There been some discussion of starting amantadine for his headaches. Patient had recent syncopal episode early one morning while urinating. It sounded more vasovagal related. He went to emergency department and lab work and EKG were unremarkable. He had absolutely no dizziness or syncope since then.  Patient is generally very healthy. Nonsmoker. No alcohol use. Works with accounting firm. Family significant for father with type 2 diabetes and history of lung cancer. Patient declines flu vaccine. Tetanus up-to-date. No family history of prostate cancer or colon cancer  Past Medical History:  Diagnosis Date  . Chicken pox   . Colon polyp    sessile - lost in stool  . Headache, chronic daily 2013   d/t lumbar puncture   . Hemorrhoids   . Iron deficiency anemia   . Migrainous dizziness 2012   Past Surgical History:  Procedure Laterality Date  . HEMORROIDECTOMY  2011, 2012  . NASAL SEPTUM SURGERY      reports that he has never smoked. He has never used smokeless tobacco. He reports that he does not drink alcohol or use drugs. family history includes Alcohol abuse in his father; Cancer in his father and paternal grandfather; Diabetes in his father. Allergies  Allergen Reactions  . Lansoprazole Other (See Comments)    "Did not feel right"     Review of Systems  Constitutional: Negative for activity change, appetite change, fatigue and fever.  HENT: Negative for congestion, ear pain and trouble swallowing.   Eyes: Negative for pain and visual disturbance.  Respiratory: Negative for cough, shortness of breath and wheezing.    Cardiovascular: Negative for chest pain and palpitations.  Gastrointestinal: Negative for abdominal distention, abdominal pain, blood in stool, constipation, diarrhea, nausea, rectal pain and vomiting.  Genitourinary: Negative for dysuria, hematuria and testicular pain.  Musculoskeletal: Negative for arthralgias and joint swelling.  Skin: Negative for rash.  Neurological: Positive for dizziness, syncope (See history of present illness ) and headaches.  Hematological: Negative for adenopathy.  Psychiatric/Behavioral: Negative for confusion and dysphoric mood.       Objective:   Physical Exam  Constitutional: He is oriented to person, place, and time. He appears well-developed and well-nourished. No distress.  HENT:  Head: Normocephalic and atraumatic.  Right Ear: External ear normal.  Left Ear: External ear normal.  Mouth/Throat: Oropharynx is clear and moist.  Eyes: Conjunctivae and EOM are normal. Pupils are equal, round, and reactive to light.  Neck: Normal range of motion. Neck supple. No thyromegaly present.  Cardiovascular: Normal rate, regular rhythm and normal heart sounds.   No murmur heard. Pulmonary/Chest: No respiratory distress. He has no wheezes. He has no rales.  Abdominal: Soft. Bowel sounds are normal. He exhibits no distension and no mass. There is no tenderness. There is no rebound and no guarding.  Genitourinary: Rectum normal and prostate normal.  Musculoskeletal: He exhibits no edema.  Lymphadenopathy:    He has no cervical adenopathy.  Neurological: He is alert and oriented to person, place, and time. He displays normal reflexes. No cranial nerve deficit.  Skin: No rash noted.  Psychiatric: He has a normal mood and affect.  Assessment:     Physical exam. Recent syncopal episode which sound highly suggestive of vasovagal with no recurrence since then    Plan:     -Labs reviewed with patient with no major concerns -Flu vaccine offered and  declined -Consider screening colonoscopy by age 50 -Discussed healthy exercise habits  Kristian CoveyBruce W Sharhonda Atwood MD [facility]

## 2015-11-09 NOTE — Patient Instructions (Signed)
Syncope, commonly known as fainting, is a temporary loss of consciousness. It occurs when the blood flow to the brain is reduced. Vasovagal syncope (also called neurocardiogenic syncope) is a fainting spell in which the blood flow to the brain is reduced because of a sudden drop in heart rate and blood pressure. Vasovagal syncope occurs when the brain and the cardiovascular system (blood vessels) do not adequately communicate and respond to each other. This is the most common cause of fainting. It often occurs in response to fear or some other type of emotional or physical stress. The body has a reaction in which the heart starts beating too slowly or the blood vessels expand, reducing blood pressure. This type of fainting spell is generally considered harmless. However, injuries can occur if a person takes a sudden fall during a fainting spell.   CAUSES   Vasovagal syncope occurs when a person's blood pressure and heart rate decrease suddenly, usually in response to a trigger. Many things and situations can trigger an episode. Some of these include:    Pain.    Fear.    The sight of blood or medical procedures, such as blood being drawn from a vein.    Common activities, such as coughing, swallowing, stretching, or going to the bathroom.    Emotional stress.    Prolonged standing, especially in a warm environment.    Lack of sleep or rest.    Prolonged lack of food.    Prolonged lack of fluids.    Recent illness.   The use of certain drugs that affect blood pressure, such as cocaine, alcohol, marijuana, inhalants, and opiates.   SYMPTOMS   Before the fainting episode, you may:    Feel dizzy or light headed.    Become pale.   Sense that you are going to faint.    Feel like the room is spinning.    Have tunnel vision, only seeing directly in front of you.    Feel sick to your stomach (nauseous).    See spots or slowly lose vision.    Hear ringing in your ears.    Have a headache.     Feel warm and sweaty.    Feel a sensation of pins and needles.  During the fainting spell, you will generally be unconscious for no longer than a couple minutes before waking up and returning to normal. If you get up too quickly before your body can recover, you may faint again. Some twitching or jerky movements may occur during the fainting spell.   DIAGNOSIS   Your health care provider will ask about your symptoms, take a medical history, and perform a physical exam. Various tests may be done to rule out other causes of fainting. These may include blood tests and tests to check the heart, such as electrocardiography, echocardiography, and possibly an electrophysiology study. When other causes have been ruled out, a test may be done to check the body's response to changes in position (tilt table test).  TREATMENT   Most cases of vasovagal syncope do not require treatment. Your health care provider may recommend ways to avoid fainting triggers and may provide home strategies for preventing fainting. If you must be exposed to a possible trigger, you can drink additional fluids to help reduce your chances of having an episode of vasovagal syncope. If you have warning signs of an oncoming episode, you can respond by positioning yourself favorably (lying down).  If your fainting spells continue, you may be   given medicines to prevent fainting. Some medicines may help make you more resistant to repeated episodes of vasovagal syncope. Special exercises or compression stockings may be recommended. In rare cases, the surgical placement of a pacemaker is considered.  HOME CARE INSTRUCTIONS    Learn to identify the warning signs of vasovagal syncope.    Sit or lie down at the first warning sign of a fainting spell. If sitting, put your head down between your legs. If you lie down, swing your legs up in the air to increase blood flow to the brain.    Avoid hot tubs and saunas.   Avoid prolonged standing.   Drink  enough fluids to keep your urine clear or pale yellow. Avoid caffeine.   Increase salt in your diet as directed by your health care provider.    If you have to stand for a long time, perform movements such as:     Crossing your legs.     Flexing and stretching your leg muscles.     Squatting.     Moving your legs.     Bending over.    Only take over-the-counter or prescription medicines as directed by your health care provider. Do not suddenly stop any medicines without asking your health care provider first.  SEEK MEDICAL CARE IF:    Your fainting spells continue or happen more frequently in spite of treatment.    You lose consciousness for more than a couple minutes.   You have fainting spells during or after exercising or after being startled.    You have new symptoms that occur with the fainting spells, such as:     Shortness of breath.    Chest pain.     Irregular heartbeat.    You have episodes of twitching or jerky movements that last longer than a few seconds.   You have episodes of twitching or jerky movements without obvious fainting.  SEEK IMMEDIATE MEDICAL CARE IF:    You have injuries or bleeding after a fainting spell.    You have episodes of twitching or jerky movements that last longer than 5 minutes.    You have more than one spell of twitching or jerky movements before returning to consciousness after fainting.     This information is not intended to replace advice given to you by your health care provider. Make sure you discuss any questions you have with your health care provider.     Document Released: 01/10/2012 Document Revised: 06/09/2014 Document Reviewed: 01/10/2012  Elsevier Interactive Patient Education 2016 [employer]

## 2015-11-29 ENCOUNTER — Telehealth: Payer: Self-pay | Admitting: Family Medicine

## 2015-11-29 ENCOUNTER — Ambulatory Visit (INDEPENDENT_AMBULATORY_CARE_PROVIDER_SITE_OTHER): Payer: BLUE CROSS/BLUE SHIELD | Admitting: Family Medicine

## 2015-11-29 DIAGNOSIS — R238 Other skin changes: Secondary | ICD-10-CM | POA: Diagnosis not present

## 2015-11-29 NOTE — Telephone Encounter (Signed)
Patient was seen today by Dr.Kim.

## 2015-11-29 NOTE — Progress Notes (Signed)
Acute visit for sensation of warmth on skin - acute. Computer systems down. Please see scanned document for details of appointment.

## 2015-11-29 NOTE — Telephone Encounter (Signed)
[facility] - Client TELEPHONE ADVICE RECORD [facility]  Patient Name: Jacob Malone  DOB: 06-04-1965    Initial Comment caller states he has warm water type feeling on left leg below the knee   Nurse Assessment  Nurse: Laural BenesJohnson, RN, Dondra SpryGail Date/Time Lamount Cohen(Eastern Time): 11/29/2015 9:56:45 AM  Confirm and document reason for call. If symptomatic, describe symptoms. You must click the next button to save text entered. ---Jacob noted on Thursday having warm water feeling on lower left leg intermittent  Has the patient traveled out of the country within the last 30 days? ---No  Does the patient have any new or worsening symptoms? ---Yes  Will a triage be completed? ---Yes  Related visit to physician within the last 2 weeks? ---No  Does the PT have any chronic conditions? (i.e. diabetes, asthma, etc.) ---Unknown  Is this a behavioral health or substance abuse call? ---No     Guidelines    Guideline Title Affirmed Question Affirmed Notes  Leg Pain [1] Thigh, calf, or ankle swelling AND [2] only 1 side    Final Disposition User   See Physician within 4 Hours (or PCP triage) Laural BenesJohnson, RN, Dondra SpryGail    Comments  NOTE Dr. Caryl NeverBurchette not in this week per schedule; given appt today 11/29/2015 with Dr. Selena BattenKim at 100pm with 1245 arrival time   Referrals  GO TO FACILITY UNDECIDED   Disagree/Comply: Comply

## 2015-11-29 NOTE — Telephone Encounter (Signed)
Pt was scheduled to see Dr. Selena BattenKim today. Due to the computer system not working I cant tell if he was here or not. Please document if he was seen. Thanks.

## 2016-06-22 ENCOUNTER — Encounter: Payer: Self-pay | Admitting: Podiatry

## 2016-06-22 ENCOUNTER — Ambulatory Visit (INDEPENDENT_AMBULATORY_CARE_PROVIDER_SITE_OTHER): Payer: BLUE CROSS/BLUE SHIELD | Admitting: Podiatry

## 2016-06-22 ENCOUNTER — Ambulatory Visit: Payer: BLUE CROSS/BLUE SHIELD

## 2016-06-22 ENCOUNTER — Ambulatory Visit (INDEPENDENT_AMBULATORY_CARE_PROVIDER_SITE_OTHER): Payer: BLUE CROSS/BLUE SHIELD

## 2016-06-22 VITALS — BP 118/75 | HR 83 | Resp 16 | Ht 71.0 in | Wt 163.0 lb

## 2016-06-22 DIAGNOSIS — M79672 Pain in left foot: Secondary | ICD-10-CM

## 2016-06-22 DIAGNOSIS — M779 Enthesopathy, unspecified: Secondary | ICD-10-CM | POA: Diagnosis not present

## 2016-06-22 DIAGNOSIS — M79671 Pain in right foot: Secondary | ICD-10-CM

## 2016-06-22 NOTE — Progress Notes (Signed)
   Subjective:    Patient ID: Jacob Malone, male    DOB: 1965-12-06, 51 y.o.   MRN: [id number]  HPI Chief Complaint  Patient presents with  . Nail Problem    Right foot; great toe; pt stated, "Has wavy lines in toenail for past couple of years; wants to have checked"  . Foot Orthotics    Wants to discuss - brought orthotics with him  . Foot Pain    Right foot; plantar forefoot, bottom of heel & arch; x1 month; Left foot-plantar forefoot & bottom of heel; 1 month       Review of Systems  Musculoskeletal: Positive for gait problem.  Neurological: Positive for dizziness and headaches.  All other systems reviewed and are negative.      Objective:   Physical Exam        Assessment & Plan:

## 2016-06-22 NOTE — Patient Instructions (Signed)

## 2016-06-26 NOTE — Progress Notes (Signed)
Subjective:    Patient ID: Jacob Malone, male   DOB: 51 y.o.   MRN: [id number]   HPI patient presents concerned about some lines in the right big toenail and also has orthotics which are no longer providing proper support and pain has developed in the right over left foot    Review of Systems  All other systems reviewed and are negative.       Objective:  Physical Exam  Cardiovascular: Intact distal pulses.   Musculoskeletal: Normal range of motion.  Neurological: He is alert.  Skin: Skin is warm.  Nursing note and vitals reviewed.  neurovascular status intact muscle strength adequate range of motion within normal limits with patient noted to have depression of the arch bilateral with foot pain and slight discoloration of the localized nature right big toenail with no indication of spread to reduce adjacent nails     Assessment:   Tendinitis noted bilateral secondary to foot structure with orthotics which lost ability to provide support along with localized changes to the right big toenail      Plan:    H&P conditions reviewed and do not recommend treatment of nail and allowed to regrow. As far as the foot structure goes patient will be seen back for us to recheck and we discussed the tendinitis-like condition

## 2016-07-07 ENCOUNTER — Encounter: Payer: Self-pay | Admitting: Podiatry

## 2016-07-07 ENCOUNTER — Ambulatory Visit (INDEPENDENT_AMBULATORY_CARE_PROVIDER_SITE_OTHER): Payer: BLUE CROSS/BLUE SHIELD | Admitting: Podiatry

## 2016-07-07 DIAGNOSIS — M779 Enthesopathy, unspecified: Secondary | ICD-10-CM

## 2016-07-07 DIAGNOSIS — M79672 Pain in left foot: Secondary | ICD-10-CM | POA: Diagnosis not present

## 2016-07-07 NOTE — Progress Notes (Signed)
Subjective:    Patient ID: Jacob Malone, male   DOB: 51 y.o.   MRN: [id number]   HPI patient states he still having pain on the bottom of the foot around the first metatarsal and it comes and goes that bothers him quite a bit    ROS      Objective:  Physical Exam neurovascular status intact with inflammation pain around the plantar aspect first metatarsal right over left with inflammatory component     Assessment:    Inflammatory capsulitis right first MPJ over left     Plan:   Dispensed dancer's pad and recommended long-term orthotics and patient is scanned for customized orthotic devices

## 2016-07-11 ENCOUNTER — Ambulatory Visit: Payer: BLUE CROSS/BLUE SHIELD | Admitting: Orthotics

## 2016-07-20 ENCOUNTER — Ambulatory Visit (INDEPENDENT_AMBULATORY_CARE_PROVIDER_SITE_OTHER): Payer: BLUE CROSS/BLUE SHIELD | Admitting: Orthotics

## 2016-07-20 DIAGNOSIS — M79672 Pain in left foot: Secondary | ICD-10-CM

## 2016-07-20 DIAGNOSIS — M779 Enthesopathy, unspecified: Secondary | ICD-10-CM

## 2016-07-20 NOTE — Progress Notes (Signed)
Patient scanned and f/o ordered through

## 2016-08-02 ENCOUNTER — Ambulatory Visit: Payer: BLUE CROSS/BLUE SHIELD | Admitting: Orthotics

## 2016-08-03 ENCOUNTER — Encounter: Payer: Self-pay | Admitting: Family Medicine

## 2016-08-03 ENCOUNTER — Ambulatory Visit (INDEPENDENT_AMBULATORY_CARE_PROVIDER_SITE_OTHER): Payer: BLUE CROSS/BLUE SHIELD | Admitting: Family Medicine

## 2016-08-03 VITALS — BP 102/80 | HR 80 | Temp 98.1°F | Ht 71.0 in | Wt 167.1 lb

## 2016-08-03 DIAGNOSIS — R1030 Lower abdominal pain, unspecified: Secondary | ICD-10-CM | POA: Diagnosis not present

## 2016-08-03 LAB — COMPREHENSIVE METABOLIC PANEL
ALBUMIN: 4.7 g/dL (ref 3.5–5.2)
ALK PHOS: 72 U/L (ref 39–117)
ALT: 14 U/L (ref 0–53)
AST: 16 U/L (ref 0–37)
BILIRUBIN TOTAL: 0.8 mg/dL (ref 0.2–1.2)
BUN: 13 mg/dL (ref 6–23)
CALCIUM: 9.6 mg/dL (ref 8.4–10.5)
CO2: 31 meq/L (ref 19–32)
CREATININE: 1.09 mg/dL (ref 0.40–1.50)
Chloride: 101 mEq/L (ref 96–112)
GFR: 76.54 mL/min (ref >60.00)
Glucose, Bld: 94 mg/dL (ref 70–99)
Potassium: 4.5 mEq/L (ref 3.5–5.1)
Sodium: 137 mEq/L (ref 135–145)
TOTAL PROTEIN: 7.2 g/dL (ref 6.0–8.3)

## 2016-08-03 LAB — POC URINALSYSI DIPSTICK (AUTOMATED)
BILIRUBIN UA: NEGATIVE
GLUCOSE UA: NEGATIVE
Ketones, UA: NEGATIVE
LEUKOCYTES UA: NEGATIVE
NITRITE UA: NEGATIVE
PH UA: 6 (ref 5.0–8.0)
Protein, UA: NEGATIVE
RBC UA: NEGATIVE
Spec Grav, UA: 1.01 (ref 1.010–1.025)
UROBILINOGEN UA: 0.2 U/dL

## 2016-08-03 LAB — CBC WITH DIFFERENTIAL/PLATELET
BASOS ABS: 0 10*3/uL (ref 0.0–0.1)
Basophils Relative: 0.4 % (ref 0.0–3.0)
EOS ABS: 0 10*3/uL (ref 0.0–0.7)
Eosinophils Relative: 0.7 % (ref 0.0–5.0)
HEMATOCRIT: 40.8 % (ref 39.0–52.0)
Hemoglobin: 13.8 g/dL (ref 13.0–17.0)
LYMPHS PCT: 17.4 % (ref 12.0–46.0)
Lymphs Abs: 1.2 10*3/uL (ref 0.7–4.0)
MCHC: 33.8 g/dL (ref 30.0–36.0)
MCV: 90.3 fl (ref 78.0–100.0)
MONOS PCT: 8.4 % (ref 3.0–12.0)
Monocytes Absolute: 0.6 10*3/uL (ref 0.1–1.0)
NEUTROS ABS: 5 10*3/uL (ref 1.4–7.7)
Neutrophils Relative %: 73.1 % (ref 43.0–77.0)
PLATELETS: 246 10*3/uL (ref 150.0–400.0)
RBC: 4.52 Mil/uL (ref 4.22–5.81)
RDW: 12.7 % (ref 11.5–15.5)
WBC: 6.8 10*3/uL (ref 4.0–10.5)

## 2016-08-03 LAB — URINALYSIS, ROUTINE W REFLEX MICROSCOPIC
BILIRUBIN URINE: NEGATIVE
KETONES UR: NEGATIVE
LEUKOCYTES UA: NEGATIVE
Nitrite: NEGATIVE
PH: 6.5 (ref 5.0–8.0)
RBC / HPF: NONE SEEN (ref 0–?)
Specific Gravity, Urine: <=1.005 — AB (ref 1.000–1.030)
TOTAL PROTEIN, URINE-UPE24: NEGATIVE
UROBILINOGEN UA: 0.2 (ref 0.0–1.0)
Urine Glucose: NEGATIVE
WBC UA: NONE SEEN (ref 0–?)

## 2016-08-03 NOTE — Patient Instructions (Signed)
BEFORE YOU LEAVE: -urine dip with reflex micro and culture if needed -labs -ordered urgent CT scan -follow up: 2-4 days with PCP  Get labs and CT as planned.  No more pepto or prune juice.  No dairy for 1 week.  Plenty of fluids.  Seek emergency care if worsening in interim.

## 2016-08-03 NOTE — Addendum Note (Signed)
Addended by: Johnella MoloneyFUNDERBURK, Teri Legacy A on: 08/03/2016 11:51 AM   Modules accepted: Orders

## 2016-08-03 NOTE — Progress Notes (Signed)
HPI:  Abdominal pain: -began last night -symptoms: pain - reports severe, cramping, bloating,  -no BM since Sunday, so he drank 11 oz prune juice, and had BM last night that was described has "hard to soft to watery" -lower abd pain with urinating, pain radiates to the back -no vomiting or fevers, hematochezia, hematuria, melena, nausea -hx of chronic issues with constipation, ? appendicitis in the past, then told was not appendicitis - still has appendix -denies recent Abx and travel outside [place] -took Pepto last night and this morning  ROS: See pertinent positives and negatives per HPI.  Past Medical History:  Diagnosis Date  . Chicken pox   . Colon polyp    sessile - lost in stool  . Headache, chronic daily 2013   d/t lumbar puncture   . Hemorrhoids   . Iron deficiency anemia   . Migrainous dizziness 2012    Past Surgical History:  Procedure Laterality Date  . HEMORROIDECTOMY  2011, 2012  . NASAL SEPTUM SURGERY      Family History  Problem Relation Age of Onset  . Alcohol abuse Father   . Diabetes Father   . Cancer Father        Lung  . Cancer Paternal Grandfather        Abdominal Cancer    Social History   Social History  . Marital status: Single    Spouse name: N/A  . Number of children: N/A  . Years of education: N/A   Social History Main Topics  . Smoking status: Never Smoker  . Smokeless tobacco: Never Used  . Alcohol use No  . Drug use: No  . Sexual activity: Not Asked   Other Topics Concern  . None   Social History Narrative  . None     Current Outpatient Prescriptions:  .  Coenzyme Q10 (COQ10) 400 MG CAPS, Take 400 mg by mouth daily., Disp: , Rfl:  .  Cyanocobalamin (B-12 PO), Take 1,875 mcg by mouth daily. , Disp: , Rfl:  .  cyclobenzaprine (FLEXERIL) 10 MG tablet, Take 1 tablet (10 mg total) by mouth 3 (three) times daily as needed for muscle spasms., Disp: 30 tablet, Rfl: 0 .  docusate sodium (COLACE) 100 MG capsule, Take 100 mg by  mouth daily as needed for mild constipation., Disp: , Rfl:  .  lamoTRIgine (LAMICTAL) 100 MG tablet, Take 100 mg by mouth daily., Disp: , Rfl: 1 .  MAGNESIUM LACTATE PO, Take 84 mg by mouth every evening. , Disp: , Rfl:  .  NON FORMULARY, Take 0.5 tablets by mouth daily. Folate, methylated b12, glucosamine complex, Disp: , Rfl:  .  Riboflavin (VITAMIN B2 PO), Take 400 mg by mouth daily. , Disp: , Rfl:   EXAM:  Vitals:   08/03/16 1044  BP: 102/80  Pulse: 80  Temp: 98.1 F (36.7 C)    Body mass index is 23.31 kg/m.  GENERAL: vitals reviewed and listed above, alert, oriented, appears well hydrated and in no acute distress  HEENT: atraumatic, conjunttiva clear, no obvious abnormalities on inspection of external nose and ears  NECK: no obvious masses on inspection  LUNGS: clear to auscultation bilaterally, no wheezes, rales or rhonchi, good air movement  CV: HRRR, no peripheral edema  MS: moves all extremities without noticeable abnormality  ABD: BS+ all 4 quadrants, soft, difuse TTP but seems more prominent on RLQ, no rebound or significant guarding  PSYCH: pleasant and cooperative, no obvious depression or anxiety  ASSESSMENT AND PLAN:  Discussed the following assessment and plan:  Lower abdominal pain - Plan: Comprehensive metabolic panel, CBC with Differential/Platelet, CT Abdomen Pelvis W Contrast -we discussed possible serious and likely etiologies, workup and treatment options, treatment risks and return precautions - while this may be related to the constipation and then the treatments for the constipation, since he is reporting severe pain opted for further eval to R/o other causes abd pain -after this discussion, [place] opted for labs, urine studies, CT abd, close follow up and ER in interim if worsening or not improving  Patient Instructions  BEFORE YOU LEAVE: -urine dip with reflex micro and culture if needed -labs -ordered urgent CT scan -follow up: 2-4 days with  PCP  Get labs and CT as planned.  No more pepto or prune juice.  No dairy for 1 week.  Plenty of fluids.  Seek emergency care if worsening in interim.    Kriste BasqueKIM, Spence Soberano R., DO

## 2016-08-03 NOTE — Addendum Note (Signed)
Addended by: Charna ElizabethLEMMONS, Kassadie Pancake L on: 08/03/2016 05:23 PM   Modules accepted: Orders

## 2016-08-04 ENCOUNTER — Inpatient Hospital Stay: Admission: RE | Admit: 2016-08-04 | Payer: BLUE CROSS/BLUE SHIELD | Source: Ambulatory Visit

## 2016-08-04 ENCOUNTER — Telehealth: Payer: Self-pay | Admitting: Family Medicine

## 2016-08-04 NOTE — Telephone Encounter (Signed)
Left message on machine for patient to return our call 

## 2016-08-04 NOTE — Telephone Encounter (Signed)
See result note.  

## 2016-08-04 NOTE — Telephone Encounter (Signed)
Pt would like results of labs. Pt states he is getting CT scan this afternoon and wants to make sure all is well.

## 2016-08-04 NOTE — Telephone Encounter (Signed)
Patient is aware of results.

## 2016-08-07 ENCOUNTER — Encounter: Payer: Self-pay | Admitting: Family Medicine

## 2016-08-07 ENCOUNTER — Ambulatory Visit (INDEPENDENT_AMBULATORY_CARE_PROVIDER_SITE_OTHER): Payer: BLUE CROSS/BLUE SHIELD | Admitting: Family Medicine

## 2016-08-07 VITALS — BP 110/78 | HR 104 | Temp 98.5°F | Wt 164.5 lb

## 2016-08-07 DIAGNOSIS — K5900 Constipation, unspecified: Secondary | ICD-10-CM

## 2016-08-07 DIAGNOSIS — R1084 Generalized abdominal pain: Secondary | ICD-10-CM | POA: Diagnosis not present

## 2016-08-07 NOTE — Progress Notes (Signed)
Subjective:     Patient ID: Jacob Malone, male   DOB: July 09, 1965, 51 y.o.   MRN: [id number]  HPI Patient seen for follow up regarding recent abdominal pain. He had multiple labs done which were basically normal. He was sent for CT abdomen pelvis. This showed probably some benign very small cyst in liver but otherwise normal. He states he is doing "better" at this time. He had some abdominal bloating last week and constipation and took some prune juice and afterwards noticed some diffuse cramping. Was seen here last Thursday. He has not had any fevers or chills. No burning with urination. No further back pain. Battles constipation intermittently. Diet reviewed. Eats a lot of good fresh vegetables and fruits and appears to be getting adequate fiber  Past Medical History:  Diagnosis Date  . Chicken pox   . Colon polyp    sessile - lost in stool  . Headache, chronic daily 2013   d/t lumbar puncture   . Hemorrhoids   . Iron deficiency anemia   . Migrainous dizziness 2012   Past Surgical History:  Procedure Laterality Date  . HEMORROIDECTOMY  2011, 2012  . NASAL SEPTUM SURGERY      reports that he has never smoked. He has never used smokeless tobacco. He reports that he does not drink alcohol or use drugs. family history includes Alcohol abuse in his father; Cancer in his father and paternal grandfather; Diabetes in his father. Allergies  Allergen Reactions  . Lansoprazole Other (See Comments)    "Did not feel right"     Review of Systems  Constitutional: Negative for appetite change, chills, fever and unexpected weight change.  Respiratory: Negative for shortness of breath.   Cardiovascular: Negative for chest pain.  Gastrointestinal: Positive for constipation. Negative for abdominal pain, diarrhea, nausea and vomiting.       Objective:   Physical Exam  Constitutional: He appears well-developed and well-nourished.  Cardiovascular: Normal rate and regular rhythm.    Pulmonary/Chest: Effort normal and breath sounds normal. No respiratory distress. He has no wheezes. He has no rales.  Abdominal: Soft. Bowel sounds are normal. He exhibits no distension and no mass. There is no tenderness. There is no rebound and no guarding.       Assessment:     Recent abdominal pain improved. Etiology not clear from evaluation including labs and CT scan. He does have intermittent constipation    Plan:     -Discussed lifestyle management of constipation. Recommend 25-30 g fiber per day -Increase walking activity -Follow-up immediately for any recurrent abdominal pain, fever, or other concerns  Kristian CoveyBruce W Avaya Mcjunkins MD [facility]

## 2016-08-07 NOTE — Patient Instructions (Signed)

## 2016-08-08 ENCOUNTER — Ambulatory Visit: Payer: BLUE CROSS/BLUE SHIELD | Admitting: Orthotics

## 2016-08-17 ENCOUNTER — Ambulatory Visit (INDEPENDENT_AMBULATORY_CARE_PROVIDER_SITE_OTHER): Payer: Self-pay | Admitting: Podiatry

## 2016-08-17 DIAGNOSIS — M779 Enthesopathy, unspecified: Secondary | ICD-10-CM

## 2016-08-17 NOTE — Progress Notes (Signed)
Patient ID: Jacob Malone, male   DOB: 1965-10-31, 51 y.o.   MRN: [id number]   Patient presents for orthotic pick up.  Orthotics are tried on and patient immediately noticed that they are to thick for his shoes.  Then the right 1st accomodation is hitting the middle of the metatarsal head causing instant pain.  I was able to adjust the unload to the correct size but will have to send the orthotics back to have the ppt removed to reduce the bulk.  I told patient that I will send them to the manufacturer for this adjustment and that is will be about 2 weeks for them to return patient states he will be unavailable for the next 2 weeks so that will work just fine.  [facility] [facility] to remove blue ppt from the forefoot and smooth the transitions from the accommodation into the toes.

## 2016-08-17 NOTE — Patient Instructions (Signed)

## 2016-09-14 ENCOUNTER — Ambulatory Visit: Payer: BLUE CROSS/BLUE SHIELD | Admitting: Orthotics

## 2016-09-14 DIAGNOSIS — M79672 Pain in left foot: Secondary | ICD-10-CM

## 2016-09-14 DIAGNOSIS — M779 Enthesopathy, unspecified: Secondary | ICD-10-CM

## 2016-09-14 NOTE — Progress Notes (Signed)
Patient came in to p/u foot orthotics and they simply would not fit in his dress shoes.  Patient also complained that they felt hard under the ball of foot. Going to have them redone without K-wedg, change top cover to p-lite.  Offer dancers pad type cushioning around first met MPJ.  

## 2016-09-28 ENCOUNTER — Ambulatory Visit: Payer: BLUE CROSS/BLUE SHIELD | Admitting: Podiatry

## 2016-09-28 ENCOUNTER — Other Ambulatory Visit: Payer: BLUE CROSS/BLUE SHIELD | Admitting: Orthotics

## 2016-10-30 ENCOUNTER — Ambulatory Visit: Payer: BLUE CROSS/BLUE SHIELD | Admitting: Podiatry

## 2016-11-13 ENCOUNTER — Ambulatory Visit (INDEPENDENT_AMBULATORY_CARE_PROVIDER_SITE_OTHER): Payer: BLUE CROSS/BLUE SHIELD | Admitting: Podiatry

## 2016-11-13 ENCOUNTER — Encounter: Payer: Self-pay | Admitting: Podiatry

## 2016-11-13 DIAGNOSIS — M79672 Pain in left foot: Secondary | ICD-10-CM | POA: Diagnosis not present

## 2016-11-13 DIAGNOSIS — M779 Enthesopathy, unspecified: Secondary | ICD-10-CM

## 2016-11-13 NOTE — Progress Notes (Signed)
Subjective:    Patient ID: Jacob Malone, male   DOB: 51 y.o.   MRN: [id number]   HPI patient states he still has trouble with some orthotics but he was able to wear new balance and did great but he still would like to be able to wear an orthotic    ROS      Objective:  Physical Exam neurovascular status intact with patient's right foot under the first metatarsal still mildly symptomatically certain types shoes but much improved from previous     Assessment:  Low grade capsulitis       Plan:    I'm going to see him in 3 weeks with recheck and he will bring his new balance tennis shoes in and we will look to see what kind modifications we could make that would allow it and orthotic to fit with a stress-type shoes

## 2016-11-30 ENCOUNTER — Ambulatory Visit: Payer: BLUE CROSS/BLUE SHIELD | Admitting: Orthotics

## 2016-11-30 DIAGNOSIS — M79672 Pain in left foot: Secondary | ICD-10-CM

## 2016-11-30 DIAGNOSIS — M779 Enthesopathy, unspecified: Secondary | ICD-10-CM

## 2016-11-30 NOTE — Progress Notes (Signed)
Patient came in today to discuss ways to get f/o to fit in dress shoes which he wears dailly; previous ones had to much bulk in FF due to attempts to both cushion and offload.   [facility]ed to [facility].  We are going to change design of F/O from offloading via EVA bottom kenetic wedge, to making k-wedge out of PPT (more compressible), and changing out topcover to vinyl.  All of this in effort for F/O to fit better in dress shoes, as patient is being challenged to find shoes that past f/o fit in.

## 2016-12-25 ENCOUNTER — Other Ambulatory Visit: Payer: BLUE CROSS/BLUE SHIELD | Admitting: Orthotics

## 2017-01-10 ENCOUNTER — Encounter: Payer: BLUE CROSS/BLUE SHIELD | Admitting: Orthotics

## 2017-01-17 ENCOUNTER — Encounter: Payer: BLUE CROSS/BLUE SHIELD | Admitting: Orthotics

## 2017-01-24 ENCOUNTER — Ambulatory Visit: Payer: BLUE CROSS/BLUE SHIELD | Admitting: Orthotics

## 2017-01-24 DIAGNOSIS — M779 Enthesopathy, unspecified: Secondary | ICD-10-CM

## 2017-01-24 NOTE — Progress Notes (Signed)
Saw patient today for fitting for remade custom f/o.  The corrected f/o came in with the top cover unglued distally, so as to attempt different placements of met pad to offer best possible outcome.  Also we have a kinetic wedge added out of poron to also attempt to offload.   Several size pads from neuroma pad to met bar was used in different placements; none was able to offset discomfort.  Patient very nice, but claims he gets the best feeling from simple OTS insert ([place]) and new balance shoes.  This is about his third visit and attempt to modify the inserts; I suggested to him after he complained of a new area of discomfort (3/4th met shaft), that he follow up with Dr. Paulla Dolly and get his advice on how to proceed.   He is making appointment in January.

## 2017-02-09 ENCOUNTER — Other Ambulatory Visit: Payer: BLUE CROSS/BLUE SHIELD | Admitting: Orthotics

## 2017-02-09 ENCOUNTER — Ambulatory Visit: Payer: BLUE CROSS/BLUE SHIELD | Admitting: Podiatry

## 2017-02-12 ENCOUNTER — Ambulatory Visit (INDEPENDENT_AMBULATORY_CARE_PROVIDER_SITE_OTHER): Payer: BLUE CROSS/BLUE SHIELD | Admitting: Podiatry

## 2017-02-12 ENCOUNTER — Ambulatory Visit: Payer: BLUE CROSS/BLUE SHIELD | Admitting: Orthotics

## 2017-02-12 ENCOUNTER — Encounter: Payer: Self-pay | Admitting: Podiatry

## 2017-02-12 DIAGNOSIS — M79672 Pain in left foot: Secondary | ICD-10-CM

## 2017-02-12 DIAGNOSIS — M779 Enthesopathy, unspecified: Secondary | ICD-10-CM

## 2017-02-12 NOTE — Progress Notes (Signed)
Subjective:   Patient ID: Jacob Malone, male   DOB: 52 y.o.   MRN: [id number]   HPI Patient presents stating he is having pain in the bottom of his right first metatarsal of a mild to moderate nature in his orthotics to give him trouble   ROS      Objective:  Physical Exam  Neurovascular status intact with patient's right foot still showing discomfort of a mild nature around the distal fascia and into the first metatarsal head      Assessment:  Low-grade inflammation of the first MPJ right over left     Plan:  H&P condition reviewed and at this point I have recommended physical therapy to be done locally along with supportive shoe gear usage anti-inflammatories in the consideration long-term for injection treatment.  Patient's orthotics will be modified again at this time

## 2017-02-12 NOTE — Progress Notes (Signed)
Will redo orthotics...wider shell, lower arch, spenco cover

## 2017-02-21 DIAGNOSIS — M1811 Unilateral primary osteoarthritis of first carpometacarpal joint, right hand: Secondary | ICD-10-CM | POA: Insufficient documentation

## 2017-02-21 DIAGNOSIS — M720 Palmar fascial fibromatosis [Dupuytren]: Secondary | ICD-10-CM | POA: Insufficient documentation

## 2017-02-21 DIAGNOSIS — R52 Pain, unspecified: Secondary | ICD-10-CM | POA: Insufficient documentation

## 2017-02-26 ENCOUNTER — Other Ambulatory Visit: Payer: BLUE CROSS/BLUE SHIELD | Admitting: Orthotics

## 2017-02-27 ENCOUNTER — Ambulatory Visit (INDEPENDENT_AMBULATORY_CARE_PROVIDER_SITE_OTHER): Payer: BLUE CROSS/BLUE SHIELD | Admitting: Family Medicine

## 2017-02-27 ENCOUNTER — Encounter: Payer: Self-pay | Admitting: Family Medicine

## 2017-02-27 VITALS — BP 96/62 | HR 84 | Temp 97.8°F | Ht 71.25 in | Wt 173.3 lb

## 2017-02-27 DIAGNOSIS — Z0000 Encounter for general adult medical examination without abnormal findings: Secondary | ICD-10-CM | POA: Diagnosis not present

## 2017-02-27 NOTE — Progress Notes (Signed)
Subjective:     Patient ID: Jacob Malone, male   DOB: 09-30-1965, 52 y.o.   MRN: [id number]  HPI Patient seen for physical exam. Generally very healthy. Takes no regular medications. Will turn 52 later this year. He had some chronic intermittent headaches and has seen neurologist for that in the past. This started after lumbar puncture several years ago. No recent progressive headaches.  Patient is nonsmoker. No consistent exercise. Immunizations up-to-date. Family history reviewed. Father had lung cancer. Mom's history is relatively unknown. He has not been in touch with her for 20 years  Past Medical History:  Diagnosis Date  . Chicken pox   . Colon polyp    sessile - lost in stool  . Headache, chronic daily 2013   d/t lumbar puncture   . Hemorrhoids   . Iron deficiency anemia   . Migrainous dizziness 2012   Past Surgical History:  Procedure Laterality Date  . HEMORROIDECTOMY  2011, 2012  . NASAL SEPTUM SURGERY      reports that  has never smoked. he has never used smokeless tobacco. He reports that he does not drink alcohol or use drugs. family history includes Alcohol abuse in his father; Cancer in his father and paternal grandfather; Diabetes in his father. Allergies  Allergen Reactions  . Lansoprazole Other (See Comments)    "Did not feel right"     Review of Systems  Constitutional: Negative for activity change, appetite change, fatigue and fever.  HENT: Negative for congestion, ear pain and trouble swallowing.   Eyes: Negative for pain and visual disturbance.  Respiratory: Negative for cough, shortness of breath and wheezing.   Cardiovascular: Negative for chest pain and palpitations.  Gastrointestinal: Negative for abdominal distention, abdominal pain, blood in stool, constipation, diarrhea, nausea, rectal pain and vomiting.  Genitourinary: Negative for dysuria, hematuria and testicular pain.  Musculoskeletal: Negative for arthralgias and joint swelling.  Skin:  Negative for rash.  Neurological: Negative for dizziness, syncope and headaches.  Hematological: Negative for adenopathy.  Psychiatric/Behavioral: Negative for confusion and dysphoric mood.       Objective:   Physical Exam  Constitutional: He is oriented to person, place, and time. He appears well-developed and well-nourished. No distress.  HENT:  Head: Normocephalic and atraumatic.  Right Ear: External ear normal.  Left Ear: External ear normal.  Mouth/Throat: Oropharynx is clear and moist.  Eyes: Conjunctivae and EOM are normal. Pupils are equal, round, and reactive to light.  Neck: Normal range of motion. Neck supple. No thyromegaly present.  Cardiovascular: Normal rate, regular rhythm and normal heart sounds.  No murmur heard. Pulmonary/Chest: No respiratory distress. He has no wheezes. He has no rales.  Abdominal: Soft. Bowel sounds are normal. He exhibits no distension and no mass. There is no tenderness. There is no rebound and no guarding.  Genitourinary: Rectum normal and prostate normal.  Musculoskeletal: He exhibits no edema.  Lymphadenopathy:    He has no cervical adenopathy.  Neurological: He is alert and oriented to person, place, and time. He displays normal reflexes. No cranial nerve deficit.  Skin: No rash noted.  Psychiatric: He has a normal mood and affect.       Assessment:     Physical exam. The following issues were discussed    Plan:     -Consider shingles vaccine at age 52 -Consider repeat colonoscopy age 52 -Lab work obtained. Patient requesting PSA -Patient declines flu vaccine  Kristian CoveyBruce W Zedrick Springsteen MD [facility]

## 2017-02-27 NOTE — Patient Instructions (Signed)
Consider repeat Colonoscopy by next year.   Consider shingles vaccine by age 52.

## 2017-03-01 ENCOUNTER — Other Ambulatory Visit (INDEPENDENT_AMBULATORY_CARE_PROVIDER_SITE_OTHER): Payer: BLUE CROSS/BLUE SHIELD

## 2017-03-01 DIAGNOSIS — Z0000 Encounter for general adult medical examination without abnormal findings: Secondary | ICD-10-CM | POA: Diagnosis not present

## 2017-03-01 LAB — BASIC METABOLIC PANEL
BUN: 19 mg/dL (ref 6–23)
CALCIUM: 9.4 mg/dL (ref 8.4–10.5)
CHLORIDE: 101 meq/L (ref 96–112)
CO2: 30 meq/L (ref 19–32)
CREATININE: 1.18 mg/dL (ref 0.40–1.50)
GFR: 69.68 mL/min (ref >60.00)
GLUCOSE: 86 mg/dL (ref 70–99)
Potassium: 4.1 mEq/L (ref 3.5–5.1)
Sodium: 137 mEq/L (ref 135–145)

## 2017-03-01 LAB — LIPID PANEL
Cholesterol: 200 mg/dL (ref 0–200)
HDL: 47.4 mg/dL (ref >39.00)
LDL Cholesterol: 127 mg/dL — ABNORMAL HIGH (ref 0–99)
NonHDL: 152.65
TRIGLYCERIDES: 128 mg/dL (ref 0.0–149.0)
Total CHOL/HDL Ratio: 4
VLDL: 25.6 mg/dL (ref 0.0–40.0)

## 2017-03-01 LAB — CBC WITH DIFFERENTIAL/PLATELET
BASOS ABS: 0 10*3/uL (ref 0.0–0.1)
Basophils Relative: 1 % (ref 0.0–3.0)
EOS ABS: 0.1 10*3/uL (ref 0.0–0.7)
Eosinophils Relative: 1.6 % (ref 0.0–5.0)
HCT: 43.4 % (ref 39.0–52.0)
Hemoglobin: 14.4 g/dL (ref 13.0–17.0)
LYMPHS ABS: 1.7 10*3/uL (ref 0.7–4.0)
Lymphocytes Relative: 40.8 % (ref 12.0–46.0)
MCHC: 33.2 g/dL (ref 30.0–36.0)
MCV: 91 fl (ref 78.0–100.0)
MONO ABS: 0.5 10*3/uL (ref 0.1–1.0)
MONOS PCT: 12.8 % — AB (ref 3.0–12.0)
NEUTROS PCT: 43.8 % (ref 43.0–77.0)
Neutro Abs: 1.8 10*3/uL (ref 1.4–7.7)
Platelets: 252 10*3/uL (ref 150.0–400.0)
RBC: 4.77 Mil/uL (ref 4.22–5.81)
RDW: 13.5 % (ref 11.5–15.5)
WBC: 4.1 10*3/uL (ref 4.0–10.5)

## 2017-03-01 LAB — HEPATIC FUNCTION PANEL
ALBUMIN: 4.5 g/dL (ref 3.5–5.2)
ALT: 14 U/L (ref 0–53)
AST: 15 U/L (ref 0–37)
Alkaline Phosphatase: 65 U/L (ref 39–117)
Bilirubin, Direct: 0.1 mg/dL (ref 0.0–0.3)
TOTAL PROTEIN: 7.2 g/dL (ref 6.0–8.3)
Total Bilirubin: 0.7 mg/dL (ref 0.2–1.2)

## 2017-03-01 LAB — TSH: TSH: 2.39 u[IU]/mL (ref 0.35–4.50)

## 2017-03-01 LAB — PSA: PSA: 0.67 ng/mL (ref 0.10–4.00)

## 2017-03-22 ENCOUNTER — Encounter: Payer: Self-pay | Admitting: Family Medicine

## 2017-03-22 ENCOUNTER — Ambulatory Visit (INDEPENDENT_AMBULATORY_CARE_PROVIDER_SITE_OTHER): Payer: BLUE CROSS/BLUE SHIELD | Admitting: Family Medicine

## 2017-03-22 ENCOUNTER — Ambulatory Visit: Payer: Self-pay

## 2017-03-22 VITALS — BP 110/78 | HR 70 | Temp 97.4°F | Wt 168.0 lb

## 2017-03-22 DIAGNOSIS — J680 Bronchitis and pneumonitis due to chemicals, gases, fumes and vapors: Secondary | ICD-10-CM

## 2017-03-22 NOTE — Telephone Encounter (Signed)
Pt. 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 States he wears a mask at work. Reason for Disposition . SEVERE (e.g., excruciating) throat pain  Answer Assessment - Initial Assessment Questions 1. ONSET: "When did the throat start hurting?" (Hours or days ago)      Started 5 weeks ago 2. SEVERITY: "How bad is the sore throat?" (Scale 1-10; mild, moderate or severe)   - MILD (1-3):  doesn't interfere with eating or normal activities   - MODERATE (4-7): interferes with eating some solids and normal activities   - SEVERE (8-10):  excruciating pain, interferes with most normal activities   - SEVERE DYSPHAGIA: can't swallow liquids, drooling     Mild 3. STREP EXPOSURE: "Has there been any exposure to strep within the past week?" If so, ask: "What type of contact occurred?"      No 4.  VIRAL SYMPTOMS: "Are there any symptoms of a cold, such as a runny nose, cough, hoarse voice or red eyes?"      No 5. FEVER: "Do you have a fever?" If so, ask: "What is your temperature, how was it measured, and when did it start?"     No 6. PUS ON THE TONSILS: "Is there pus on the tonsils in the back of your throat?"     No 7. OTHER SYMPTOMS: "Do you have any other symptoms?" (e.g., difficulty breathing, headache, rash)     No 8. PREGNANCY: "Is there any chance you are pregnant?" "When was your last menstrual period?"     No  Protocols used: SORE THROAT-A-AH

## 2017-03-22 NOTE — Progress Notes (Signed)
Subjective:    Patient ID: Jacob Malone, male    DOB: [birth year], 52 y.o.   MRN: [id number]  No chief complaint on file.   HPI Patient was seen today for acute concern.  Patient endorses sore/hoarse throat, difficulty breathing, chest pain/tightness when at work inhaling paint fumes.  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  The building is poorly ventilated and the fumes have been irritating the pt.  Pt states when he is away from work and fresh air his symptoms resolved.  Pt states he even called poison control a few weeks ago and they recommended he stay away from the fumes.  Past Medical History:  Diagnosis Date  . Chicken pox   . Colon polyp    sessile - lost in stool  . Headache, chronic daily 2013   d/t lumbar puncture   . Hemorrhoids   . Iron deficiency anemia   . Migrainous dizziness 2012    Allergies  Allergen Reactions  . Lansoprazole Other (See Comments)    "Did not feel right"    ROS General: Denies fever, chills, night sweats, changes in weight, changes in appetite  +HAs, endorses a baseline HA, dizziness HEENT: Denies headaches, ear pain, changes in vision, rhinorrhea  + sore throat/hoarse voice CV: Denies palpitations, orthopnea  +SOB, CP and tightness Pulm: Denies cough, wheezing  +SOB GI: Denies abdominal pain, nausea, vomiting, diarrhea, constipation GU: Denies dysuria, hematuria, frequency, vaginal discharge Msk: Denies muscle cramps, joint pains Neuro: Denies weakness, numbness, tingling Skin: Denies rashes, bruising Psych: Denies depression, anxiety, hallucinations     Objective:    Blood pressure 110/78, pulse 70, temperature (!) 97.4 F (36.3 C), temperature source Oral, weight 168 lb (76.2 kg), SpO2 98 %.   Gen. Pleasant, well-nourished, in no distress, normal affect   HEENT: [place]/AT, face symmetric,no scleral icterus, PERRLA, nares patent without drainage, pharynx with mild erythema, no or exudate.  TMs normal  bilaterally.  No cervical lymphadenopathy. Lungs: no accessory muscle use, CTAB, no wheezes or rales Cardiovascular: RRR, no m/r/g, no peripheral edema Abdomen: BS present, soft, NT/ND Neuro:  A&Ox3, CN II-XII intact, normal gait   Wt Readings from Last 3 Encounters:  03/22/17 168 lb (76.2 kg)  02/27/17 173 lb 4.8 oz (78.6 kg)  08/07/16 164 lb 8 oz (74.6 kg)    Lab Results  Component Value Date   WBC 4.1 03/01/2017   HGB 14.4 03/01/2017   HCT 43.4 03/01/2017   PLT 252.0 03/01/2017   GLUCOSE 86 03/01/2017   CHOL 200 03/01/2017   TRIG 128.0 03/01/2017   HDL 47.40 03/01/2017   LDLCALC 127 (H) 03/01/2017   ALT 14 03/01/2017   AST 15 03/01/2017   NA 137 03/01/2017   K 4.1 03/01/2017   CL 101 03/01/2017   CREATININE 1.18 03/01/2017   BUN 19 03/01/2017   CO2 30 03/01/2017   TSH 2.39 03/01/2017   PSA 0.67 03/01/2017   INR 1.00 12/31/2009    Assessment/Plan:  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 -f/u prn. -consider contacting [employer]  Abbe AmsterdamShannon Katriana Dortch, MD

## 2017-03-22 NOTE — Patient Instructions (Addendum)
Chemical Inhalation Injury A chemical inhalation injury is an internal injury, such as lung damage, that results from breathing in fumes of a chemical or harmful substance (toxic agent). Chemical inhalation injuries most often occur:  During fires, when materials that are burned release chemicals into the environment.  During work accidents, when large quantities of toxic chemicals are spilled at factories or industrial sites. Chemical inhalation injuries vary in severity. An injury tends to be more severe:  The more acidic or alkaline the chemical is.  The more concentrated the substance is.  The longer you are exposed to the substance. RISK FACTORS You are at a high risk for a chemical inhalation injury if you:  Are exposed to burning materials.  Work with chemicals, solvents, or cleaners. SIGNS AND SYMPTOMS Symptoms of a chemical inhalation injury may include:  Hoarse voice.  Shortness of breath or trouble breathing.  Chest pain.  Pale or blue skin.  Mucus production.  Cough.  Weakness.  Dizziness or fainting. DIAGNOSIS Most chemical inhalation injuries can be diagnosed with a physical exam and medical history. Tests may be done to check for lung damage. They may include:  A blood oxygen level test.  A chest X-ray.  Pulmonary function tests. There are no tests to identify the specific chemical or substance that caused the injury. TREATMENT  There is no specific treatment for a chemical inhalation injury. Most treatment is directed at improving the ability of the lungs to deliver oxygen to the body. Time is needed for lung tissue to heal. Supportive treatment may include:  Aerosol treatments to decrease swelling in the airways.  Suctioning of the airways to remove excess mucus.  Supplemental oxygen. HOME CARE INSTRUCTIONS  Do not use any tobacco products, including cigarettes, chewing tobacco, or electronic cigarettes. If you need help quitting, ask your  health care provider.  Do not allow yourself to be exposed to any airway irritants, such as cigarette smoke or smoke from a fireplace.  Follow your health care provider's instructions for the use of any inhalers.  Take medicines only as directed by your health care provider.  Keep all follow-up visits as directed by your health care provider. This is important. SEEK MEDICAL CARE IF:  Your symptoms are not improving as your health care provider predicted. SEEK IMMEDIATE MEDICAL CARE IF:  Your symptoms get worse.  You have increasing shortness of breath or wheezing.  Your skin or your lips appear very pale or blue.  You have a persistent cough.  You cough up blood or dark material.  You have chest pain or weakness.  You have a fever.  You faint. This information is not intended to replace advice given to you by your health care provider. Make sure you discuss any questions you have with your health care provider. Document Released: 09/26/2013 Document Reviewed: 09/26/2013 Elsevier Interactive Patient Education  2017 [employer] Inc.  

## 2017-03-22 NOTE — Telephone Encounter (Signed)
Noted  

## 2017-03-30 ENCOUNTER — Encounter: Payer: Self-pay | Admitting: Family Medicine

## 2017-03-30 ENCOUNTER — Ambulatory Visit (INDEPENDENT_AMBULATORY_CARE_PROVIDER_SITE_OTHER): Payer: BLUE CROSS/BLUE SHIELD | Admitting: Family Medicine

## 2017-03-30 VITALS — BP 120/84 | HR 78 | Temp 97.8°F | Wt 168.5 lb

## 2017-03-30 DIAGNOSIS — R079 Chest pain, unspecified: Secondary | ICD-10-CM

## 2017-03-30 NOTE — Progress Notes (Signed)
Subjective:     Patient ID: Jacob Malone, male   DOB: Oct 31, 1965, 52 y.o.   MRN: [id number]  HPI Patient seen with chief complaint of chest pain. He states he had about 5 days of relatively constant achy quality pain relatively mild about 4 out of 10 severity. Location is mid substernal region without radiation. No exacerbating factors. Generally does not exert a lot. He has had some increased work stress. Denies any dyspnea. No GERD symptoms. Denies a nausea or diaphoresis. No cough. Denies any recent overuse activities. No abdominal pain.  Patient is nonsmoker. No known family history of premature CAD. No hypertension or diabetes. Recent lipids LDL cholesterol 127 with HDL 47  Past Medical History:  Diagnosis Date  . Chicken pox   . Colon polyp    sessile - lost in stool  . Headache, chronic daily 2013   d/t lumbar puncture   . Hemorrhoids   . Iron deficiency anemia   . Migrainous dizziness 2012   Past Surgical History:  Procedure Laterality Date  . HEMORROIDECTOMY  2011, 2012  . NASAL SEPTUM SURGERY      reports that  has never smoked. he has never used smokeless tobacco. He reports that he does not drink alcohol or use drugs. family history includes Alcohol abuse in his father; Cancer in his father and paternal grandfather; Diabetes in his father. Allergies  Allergen Reactions  . Lansoprazole Other (See Comments)    "Did not feel right"     Review of Systems  Constitutional: Negative for appetite change, fatigue, fever and unexpected weight change.  HENT: Negative for trouble swallowing.   Eyes: Negative for visual disturbance.  Respiratory: Negative for cough, chest tightness and shortness of breath.   Cardiovascular: Positive for chest pain. Negative for palpitations and leg swelling.  Gastrointestinal: Negative for abdominal pain, nausea and vomiting.  Musculoskeletal: Negative for back pain.  Neurological: Negative for dizziness, syncope, weakness, light-headedness  and headaches.  Hematological: Negative for adenopathy.       Objective:   Physical Exam  Constitutional: He appears well-developed and well-nourished.  Cardiovascular: Normal rate and regular rhythm.  No murmur heard. Pulmonary/Chest: Effort normal and breath sounds normal. No respiratory distress. He has no wheezes. He has no rales. He exhibits no tenderness.  Abdominal: Soft. There is no tenderness.  Musculoskeletal: He exhibits no edema.       Assessment:     Atypical chest pain. This is relatively constant and patient appears be fairly low risk for CAD.  doubt cardiac    Plan:     -obtain EKG=NSR with no acute changes - recommend observation for now.  Follow-up immediately for any dyspnea, exertional features or any progressive symptoms.  Kristian CoveyBruce W Burchette MD [facility]

## 2017-03-30 NOTE — Patient Instructions (Signed)
Nonspecific Chest Pain °Chest pain can be caused by many different conditions. There is always a chance that your pain could be related to something serious, such as a heart attack or a blood clot in your lungs. Chest pain can also be caused by conditions that are not life-threatening. If you have chest pain, it is very important to follow up with your health care provider. °What are the causes? °Causes of this condition include: °· Heartburn. °· Pneumonia or bronchitis. °· Anxiety or stress. °· Inflammation around your heart (pericarditis) or lung (pleuritis or pleurisy). °· A blood clot in your lung. °· A collapsed lung (pneumothorax). This can develop suddenly on its own (spontaneous pneumothorax) or from trauma to the chest. °· Shingles infection (varicella-zoster virus). °· Heart attack. °· Damage to the bones, muscles, and cartilage that make up your chest wall. This can include: °? Bruised bones due to injury. °? Strained muscles or cartilage due to frequent or repeated coughing or overwork. °? Fracture to one or more ribs. °? Sore cartilage due to inflammation (costochondritis). ° °What increases the risk? °Risk factors for this condition may include: °· Activities that increase your risk for trauma or injury to your chest. °· Respiratory infections or conditions that cause frequent coughing. °· Medical conditions or overeating that can cause heartburn. °· Heart disease or family history of heart disease. °· Conditions or health behaviors that increase your risk of developing a blood clot. °· Having had chicken pox (varicella zoster). ° °What are the signs or symptoms? °Chest pain can feel like: °· Burning or tingling on the surface of your chest or deep in your chest. °· Crushing, pressure, aching, or squeezing pain. °· Dull or sharp pain that is worse when you move, cough, or take a deep breath. °· Pain that is also felt in your back, neck, shoulder, or arm, or pain that spreads to any of these  areas. ° °Your chest pain may come and go, or it may stay constant. °How is this diagnosed? °Lab tests or other studies may be needed to find the cause of your pain. Your health care provider may have you take a test called an ECG (electrocardiogram). An ECG records your heartbeat patterns at the time the test is performed. You may also have other tests, such as: °· Transthoracic echocardiogram (TTE). In this test, sound waves are used to create a picture of the heart structures and to look at how blood flows through your heart. °· Transesophageal echocardiogram (TEE). This is a more advanced imaging test that takes images from inside your body. It allows your health care provider to see your heart in finer detail. °· Cardiac monitoring. This allows your health care provider to monitor your heart rate and rhythm in real time. °· Holter monitor. This is a portable device that records your heartbeat and can help to diagnose abnormal heartbeats. It allows your health care provider to track your heart activity for several days, if needed. °· Stress tests. These can be done through exercise or by taking medicine that makes your heart beat more quickly. °· Blood tests. °· Other imaging tests. ° °How is this treated? °Treatment depends on what is causing your chest pain. Treatment may include: °· Medicines. These may include: °? Acid blockers for heartburn. °? Anti-inflammatory medicine. °? Pain medicine for inflammatory conditions. °? Antibiotic medicine, if an infection is present. °? Medicines to dissolve blood clots. °? Medicines to treat coronary artery disease (CAD). °· Supportive care for conditions that   do not require medicines. This may include: °? Resting. °? Applying heat or cold packs to injured areas. °? Limiting activities until pain decreases. ° °Follow these instructions at home: °Medicines °· If you were prescribed an antibiotic, take it as told by your health care provider. Do not stop taking the  antibiotic even if you start to feel better. °· Take over-the-counter and prescription medicines only as told by your health care provider. °Lifestyle °· Do not use any products that contain nicotine or tobacco, such as cigarettes and e-cigarettes. If you need help quitting, ask your health care provider. °· Do not drink alcohol. °· Make lifestyle changes as directed by your health care provider. These may include: °? Getting regular exercise. Ask your health care provider to suggest some activities that are safe for you. °? Eating a heart-healthy diet. A registered dietitian can help you to learn healthy eating options. °? Maintaining a healthy weight. °? Managing diabetes, if necessary. °? Reducing stress, such as with yoga or relaxation techniques. °General instructions °· Avoid any activities that bring on chest pain. °· If heartburn is the cause for your chest pain, raise (elevate) the head of your bed about 6 inches (15 cm) by putting blocks under the legs. Sleeping with more pillows does not effectively relieve heartburn because it only changes the position of your head. °· Keep all follow-up visits as told by your health care provider. This is important. This includes any further testing if your chest pain does not go away. °Contact a health care provider if: °· Your chest pain does not go away. °· You have a rash with blisters on your chest. °· You have a fever. °· You have chills. °Get help right away if: °· Your chest pain is worse. °· You have a cough that gets worse, or you cough up blood. °· You have severe pain in your abdomen. °· You have severe weakness. °· You faint. °· You have sudden, unexplained chest discomfort. °· You have sudden, unexplained discomfort in your arms, back, neck, or jaw. °· You have shortness of breath at any time. °· You suddenly start to sweat, or your skin gets clammy. °· You feel nauseous or you vomit. °· You suddenly feel light-headed or dizzy. °· Your heart begins to beat  quickly, or it feels like it is skipping beats. °These symptoms may represent a serious problem that is an emergency. Do not wait to see if the symptoms will go away. Get medical help right away. Call your local emergency services (911 in the [place] Do not drive yourself to the hospital. °This information is not intended to replace advice given to you by your health care provider. Make sure you discuss any questions you have with your health care provider. °Document Released: 11/02/2004 Document Revised: 10/18/2015 Document Reviewed: 10/18/2015 °Elsevier Interactive Patient Education © 2017 [employer] ° °

## 2017-04-10 ENCOUNTER — Institutional Professional Consult (permissible substitution): Payer: BLUE CROSS/BLUE SHIELD | Admitting: Internal Medicine

## 2017-04-10 ENCOUNTER — Encounter: Payer: Self-pay | Admitting: *Deleted

## 2017-04-10 ENCOUNTER — Ambulatory Visit: Payer: Self-pay

## 2017-04-10 NOTE — Telephone Encounter (Signed)
Patient call returned, he said "I already made an appointment for this afternoon with my pulmonary doctor."

## 2017-04-10 NOTE — Telephone Encounter (Signed)
This encounter was created in error - please disregard.

## 2017-04-11 ENCOUNTER — Encounter: Payer: Self-pay | Admitting: Internal Medicine

## 2017-04-11 ENCOUNTER — Encounter: Payer: Self-pay | Admitting: *Deleted

## 2017-04-11 ENCOUNTER — Ambulatory Visit: Payer: BLUE CROSS/BLUE SHIELD | Admitting: Internal Medicine

## 2017-04-11 VITALS — BP 132/78 | HR 77 | Resp 16 | Ht 71.4 in | Wt 166.0 lb

## 2017-04-11 DIAGNOSIS — Z579 Occupational exposure to unspecified risk factor: Secondary | ICD-10-CM | POA: Diagnosis not present

## 2017-04-11 DIAGNOSIS — Z578 Occupational exposure to other risk factors: Secondary | ICD-10-CM

## 2017-04-11 DIAGNOSIS — R0609 Other forms of dyspnea: Secondary | ICD-10-CM

## 2017-04-11 DIAGNOSIS — J4541 Moderate persistent asthma with (acute) exacerbation: Secondary | ICD-10-CM | POA: Diagnosis not present

## 2017-04-11 MED ORDER — AMBULATORY NON FORMULARY MEDICATION: 0 refills | Status: DC

## 2017-04-11 MED ORDER — FLUTICASONE FUROATE 200 MCG/ACT IN AEPB: 1.0000 | INHALATION_SPRAY | Freq: Every day | RESPIRATORY_TRACT | 5 refills | Status: DC

## 2017-04-11 MED ORDER — ALBUTEROL SULFATE HFA 108 (90 BASE) MCG/ACT IN AERS: 1.0000 | INHALATION_SPRAY | RESPIRATORY_TRACT | 5 refills | Status: DC | PRN

## 2017-04-11 NOTE — Patient Instructions (Addendum)
Buy a "Peak Flow Meter" check your numbers once every morning (including weekends), and once again in the afternoon (at work and on the weekends).   After doing above for one week, then:  start fluticasone inhaler, take 1 puff daily, rinse mouth after use.  Use rescue inhaler 1-2 puffs as needed when you have symptoms.   Followup in 4-6 weeks with recording of peak flow meter results.

## 2017-04-11 NOTE — Progress Notes (Addendum)
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  - Patient said symptoms and signs of occupational asthma, in addition he has a some symptoms of body aches which may be consistent with reactive airways dysfunction syndrome, likely occupational exposure associated. -Discussed that I will start him on a steroid inhaler to be used daily, as well as rescue inhaler for as needed symptoms.  In addition I asked him to get a peak film later and record his peak flow readings twice a day, in the morning before work, in the afternoon at work, including weekends. -We discussed ways that we can try to reduce his exposure such as working in places and have less fumes.  He asked for a letter, which I can provide to his employer recommending that his exposures be minimized. -Patient is concerned whether this condition will cause long-term problems.  I discussed with him that while workplace asthma is unlikely to turn into long-term asthma it is possible that his symptoms could persist and he could develop sensitization to other triggers.  Spent approximately 35 minutes in counseling patient.  --Chest Xray before followup.   --A workplace note was provided recommending that he work in well ventilated areas in which there is minimal exposure to the offending fumes.    Date: 04/11/2017  MRN# [id number] Jacob Malone 1965/05/24    Jacob E Fairfax is a 52 y.o. old male seen in consultation for chief complaint of:    Chief Complaint  Patient presents with  . 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 He was seen by pcp on 03/22/17  . Shortness of Breath    sx are resolved when not in the building.  . Chest Pain    sx are resolved when not in the building.    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  The work has been going on for about 3 months and the symptoms started back then almost immediately. The symptoms come on immediately on going to work, and stays for several hours after leaving. On weekends when he does not work his breathing is normal.  He was wearing a mask at work, he thinks that it helped a bit, but it hurts his nose. He has had ongoing issues with headaches for several years.  When the symptoms come on his chest, throat, muscles begin to ache and he can taste the fumes, his throat will get raspy. After going home his symptoms improve after a few hours.  He went to his PCP and got a note and took it to HR, he worked at home for a week but now he has been told that if he takes more time off he needs to use PTO. He is now going in and working for as long as possible then work from home. However his symptoms persist even, he feels that the fumes are persistent.  Symptoms are made worse by being exposed to fumes at work, appear to be better when he is away from work breathing fresh air. He has never been a smoker, he has never had respiratory problems in the past, he does not have allergies, his allergy testing was negative.  He does not have trouble during pollen season. He does not have a pet. He denies reflux, denies sinus drainage.     Imaging personally reviewed, chest x-ray 10/06/14; normal lungs.   CBC 03/01/17, absolute eosinophils 100.   PMHX:   Past Medical History:  Diagnosis Date  . Chicken pox   . Colon polyp    sessile - lost in stool  . Headache, chronic daily 2013   d/t lumbar puncture   . Hemorrhoids   . Iron deficiency anemia   . Migrainous dizziness 2012   Surgical Hx:  Past Surgical History:  Procedure Laterality Date  . HEMORROIDECTOMY  2011, 2012  . NASAL SEPTUM SURGERY     Family Hx:  Family History  Problem Relation  Age of Onset  . Alcohol abuse Father   . Diabetes Father   . Cancer Father        Lung  . Cancer Paternal Grandfather        Abdominal Cancer   Social Hx:   Social History   Tobacco Use  . Smoking status: Never Smoker  . Smokeless tobacco: Never Used  Substance Use Topics  . Alcohol use: No  . Drug use: No   Medication:    Current Outpatient Medications:  .  Coenzyme Q10 (COQ10) 400 MG CAPS, Take 400 mg by mouth daily., Disp: , Rfl:  .  Cyanocobalamin (B-12 PO), Take 1,875 mcg by mouth daily. , Disp: , Rfl:  .  cyclobenzaprine (FLEXERIL) 10 MG tablet, Take 1 tablet (10 mg total) by mouth 3 (three) times daily as needed for muscle spasms., Disp: 30 tablet, Rfl: 0 .  docusate sodium (COLACE) 100 MG capsule, Take 100 mg by mouth daily as needed for mild constipation., Disp: , Rfl:  .  lamoTRIgine (LAMICTAL) 100 MG tablet, Take 100 mg by mouth daily., Disp: , Rfl: 1 .  MAGNESIUM LACTATE PO, Take 84 mg by mouth every evening. , Disp: , Rfl:  .  NON FORMULARY, Take 0.5 tablets by mouth daily. Folate, methylated b12, glucosamine complex, Disp: , Rfl:  .  Riboflavin (VITAMIN B2 PO), Take 400 mg by mouth daily. , Disp: , Rfl:    Allergies:  Lansoprazole  Review of Systems: Gen:  Denies  fever, sweats, chills HEENT: Denies blurred vision, double vision. bleeds, sore throat Cvc:  No dizziness, chest pain. Resp:   Denies cough or sputum production, shortness of breath Gi: Denies swallowing difficulty, stomach pain. Gu:  Denies bladder incontinence, burning urine Ext:   No Joint pain, stiffness. Skin: No skin rash,  hives  Endoc:  No polyuria, polydipsia. Psych: No depression, insomnia. Other:  All other systems were reviewed with the patient and were negative other that what is mentioned in the HPI.   Physical Examination:   VS: BP 132/78 (BP Location: Left Arm, Cuff Size: Normal)   Pulse 77   Resp 16   Ht 5' 11.4" (1.814 m)   Wt 166 lb (75.3 kg)   SpO2 100%   BMI 22.89  kg/m   General Appearance: No distress  Neuro:without focal findings,  speech normal,  HEENT: PERRLA, EOM intact.   Pulmonary: normal breath sounds, No wheezing.  CardiovascularNormal S1,S2.  No m/r/g.   Abdomen: Benign, Soft, non-tender. Renal:  No costovertebral tenderness  GU:  No performed at this time. Endoc: No evident thyromegaly, no signs of acromegaly. Skin:   warm, no rashes, no ecchymosis  Extremities: normal, no cyanosis, clubbing.  Other findings:  LABORATORY PANEL:   CBC No results for input(s): WBC, HGB, HCT, PLT in the last 168 hours. ------------------------------------------------------------------------------------------------------------------  Chemistries  No results for input(s): NA, K, CL, CO2, GLUCOSE, BUN, CREATININE, CALCIUM, MG, AST, ALT, ALKPHOS, BILITOT in the last 168 hours.  Invalid input(s): GFRCGP ------------------------------------------------------------------------------------------------------------------  Cardiac Enzymes No results for input(s): TROPONINI in the last 168 hours. ------------------------------------------------------------  RADIOLOGY:  No results found.     Thank  you for the consultation and for allowing [facility] to assist in the care of your patient. Our recommendations are noted above.  Please contact us if we can be of further service.   Wells Guileseep Panayiotis [place], MD.  Board Certified in Internal Medicine, Pulmonary Medicine, Critical Care Medicine, and Sleep Medicine.  [facility] Office Number: [phone]  Santiago Gladavid Kasa, M.D.  Billy Fischeravid Simonds, M.D  04/11/2017

## 2017-04-20 ENCOUNTER — Telehealth: Payer: Self-pay | Admitting: Internal Medicine

## 2017-04-20 NOTE — Telephone Encounter (Signed)
Spoke with pt who states that the HR dept at this job wants a letter that is more specific. Pt states he needs to be on the phone with his HR dept, DR and himself. Informed pt we can schedule him in a 30 minute appt slot and he can put his HR dept on the phone while at his visit. Pt states he will contact HR again and will call back to schedule appt if needed.

## 2017-04-20 NOTE — Telephone Encounter (Signed)
Patient calling in regards to letter for work he received at last visit on 3/6 His employer is requesting confirmation and additional information Please call to discuss

## 2017-04-23 NOTE — Progress Notes (Signed)
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  - Patient said symptoms and signs of occupational asthma, in addition he has a some symptoms of body aches which may be consistent with reactive airways dysfunction syndrome, likely occupational exposure associated. -Review of peak flow readings show a slight reduction at, and after work exposure. - Recommended to start Arnuity inhaler, and use albuterol 1-2 puffs when having symptoms.  I had a discussion with Jacob today about his persistent symptoms at work.  He notes continued symptoms, and comes in today because his HR department would like to speak with me in regards to how to reduce his symptoms.  Therefore in the patient's presence and at his request I called the HR department at [phone], and spoke with him via speaker phone and patient's presence.  HR asked for clarification on what the patient needs to reduce his symptoms.  I instructed them that the patient needs a well ventilated area so that he does not have symptoms, he would also be desirable for the patient to be placed in areas where there is not renovation ongoing, or recently which seems to be the issue which causes his asthma symptoms.  These asthma symptoms can be triggered by fumes, and can persist for weeks, or possibly months.  I explained that I am also now starting the patient on treatment, and I am hopeful that his symptoms will resolve or at least significantly improved.  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    Date: 04/23/2017  MRN# [id number] Jacob Malone 1965-07-19    Jacob Malone is a 52 y.o. old male seen in consultation for chief complaint of:      Chief Complaint  Patient presents with  . Follow-up    pt here today for an addendum to his work note that needs more clarity.    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 At last visit he was started on a steroid inhaler and given a peak flow meter and asked to take measurements at work and at home.  He was given a letter at last visit which asked that he be placed in settings that reduce his potential exposure to fumes which cause him trouble, they felt it was too vague.  Since his last is last visit he has been working in the office every Monday, and then works at other locations and notes that he still has trouble breathing at work and headache, unchanged from previous.  It takes a few days to normalize.  He has been checking peak flows, normal readings are 480 to 490, at work then was 460 to 470 at work. He has not yet started arnuity.   He has never been a smoker, he has never had respiratory problems in the past, he does not have allergies, his allergy testing was negative. He does not have trouble during pollen season. He does not have a pet. He  denies reflux, denies sinus drainage.   Imaging personally reviewed, chest x-ray 10/06/14; normal lungs.   CBC 03/01/17, absolute eosinophils 100. Medication:    Current Outpatient Medications:  .  albuterol (PROAIR HFA) 108 (90 Base) MCG/ACT inhaler, Inhale 1-2 puffs into the lungs every 4 (four) hours as needed for wheezing or shortness of breath., Disp: 1 Inhaler, Rfl: 5 .  AMBULATORY NON FORMULARY MEDICATION, Medication Name: Peak flow meter. Use once every morning and once every afternoon. DX:J45.41, Disp: 1 each, Rfl: 0 .  Coenzyme Q10 (COQ10) 400 MG CAPS, Take 400 mg by mouth daily., Disp: , Rfl:  .  Cyanocobalamin  (B-12 PO), Take 1,875 mcg by mouth daily. , Disp: , Rfl:  .  cyclobenzaprine (FLEXERIL) 10 MG tablet, Take 1 tablet (10 mg total) by mouth 3 (three) times daily as needed for muscle spasms., Disp: 30 tablet, Rfl: 0 .  docusate sodium (COLACE) 100 MG capsule, Take 100 mg by mouth daily as needed for mild constipation., Disp: , Rfl:  .  Fluticasone Furoate 200 MCG/ACT AEPB, Inhale 1 puff into the lungs daily., Disp: 30 each, Rfl: 5 .  lamoTRIgine (LAMICTAL) 100 MG tablet, Take 100 mg by mouth daily., Disp: , Rfl: 1 .  MAGNESIUM LACTATE PO, Take 84 mg by mouth every evening. , Disp: , Rfl:  .  NON FORMULARY, Take 0.5 tablets by mouth daily. Folate, methylated b12, glucosamine complex, Disp: , Rfl:  .  Riboflavin (VITAMIN B2 PO), Take 400 mg by mouth daily. , Disp: , Rfl:    Allergies:  Lansoprazole  Review of Systems: Gen:  Denies  fever, sweats, chills HEENT: Denies blurred vision, double vision. bleeds, sore throat Cvc:  No dizziness, chest pain. Resp:   Denies cough or sputum production, shortness of breath Gi: Denies swallowing difficulty, stomach pain. Gu:  Denies bladder incontinence, burning urine Ext:   No Joint pain, stiffness. Skin: No skin rash,  hives  Endoc:  No polyuria, polydipsia. Psych: No depression, insomnia. Other:  All other systems were reviewed with the patient and were negative other that what is mentioned in the HPI.   Physical Examination:   VS: BP 100/60 (BP Location: Left Arm, Cuff Size: Normal)   Pulse 74   Resp 16   Ht 5' 11.4" (1.814 m)   Wt 168 lb (76.2 kg)   SpO2 98%   BMI 23.17 kg/m   General Appearance: No distress  Neuro:without focal findings,  speech normal,  HEENT: PERRLA, EOM intact.   Pulmonary: normal breath sounds, No wheezing.  CardiovascularNormal S1,S2.  No m/r/g.   Abdomen: Benign, Soft, non-tender. Renal:  No costovertebral tenderness  GU:  No performed at this time. Endoc: No evident thyromegaly, no signs of acromegaly. Skin:    warm, no rashes, no ecchymosis  Extremities: normal, no cyanosis, clubbing.  Other findings:    LABORATORY PANEL:   CBC No results for input(s): WBC, HGB, HCT, PLT in the last 168 hours. ------------------------------------------------------------------------------------------------------------------  Chemistries  No results for input(s): NA, K, CL, CO2, GLUCOSE, BUN, CREATININE, CALCIUM, MG, AST, ALT, ALKPHOS, BILITOT in the last 168 hours.  Invalid input(s): GFRCGP ------------------------------------------------------------------------------------------------------------------  Cardiac Enzymes No results for input(s): TROPONINI in the last 168 hours. ------------------------------------------------------------  RADIOLOGY:  No results found.     Thank  you for the consultation and for allowing [facility] to assist in the care of your patient. Our recommendations are noted above.  Please contact us if we can be of further  service.   Wells Guiles, MD.  Board Certified in Internal Medicine, Pulmonary Medicine, Critical Care Medicine, and Sleep Medicine.  [facility] Office Number: [phone]  Santiago Glad, M.D.  Billy Fischer, M.D  04/23/2017

## 2017-04-24 ENCOUNTER — Ambulatory Visit: Payer: BLUE CROSS/BLUE SHIELD | Admitting: Internal Medicine

## 2017-04-24 ENCOUNTER — Encounter: Payer: Self-pay | Admitting: Internal Medicine

## 2017-04-24 VITALS — BP 100/60 | HR 74 | Resp 16 | Ht 71.4 in | Wt 168.0 lb

## 2017-04-24 DIAGNOSIS — Z578 Occupational exposure to other risk factors: Secondary | ICD-10-CM | POA: Diagnosis not present

## 2017-04-24 DIAGNOSIS — J4541 Moderate persistent asthma with (acute) exacerbation: Secondary | ICD-10-CM | POA: Diagnosis not present

## 2017-04-24 DIAGNOSIS — R0609 Other forms of dyspnea: Secondary | ICD-10-CM

## 2017-04-24 DIAGNOSIS — Z579 Occupational exposure to unspecified risk factor: Secondary | ICD-10-CM | POA: Diagnosis not present

## 2017-04-24 NOTE — Patient Instructions (Addendum)
--  Start Arnuity inhaler once daily, rinse mouth after use.  --Use proair 1 puff if you have labored breathing, take 1 more puff 5 minutes later if symptoms persist. Take no more than 2 puffs every 4 hours.

## 2017-05-09 ENCOUNTER — Ambulatory Visit: Payer: BLUE CROSS/BLUE SHIELD | Admitting: Internal Medicine

## 2017-05-21 NOTE — Progress Notes (Deleted)
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  - Patient said symptoms and signs of occupational asthma, in addition he has a some symptoms of body aches which may be consistent with reactive airways dysfunction syndrome, likely occupational exposure associated. -Review of peak flow readings show a slight reduction at, and after work exposure. - Recommended to start Arnuity inhaler, and use albuterol 1-2 puffs when having symptoms.    Date: 05/21/2017  MRN# [id number] Jacob Malone 12-Nov-1965    Jacob Malone is a 52 y.o. old male seen in consultation for chief complaint of:    No chief complaint on file.   HPI:  Patient is a 52 year old male with a diagnosis of facial asthma, appears to have been made worse by recent renovation and painting at work.  Patient had peak flow meter which showed slight declines when the patient was at work or soon after work.  At last visit he was started on Arnuity inhaler.  He has been checking peak flows, normal readings are 480 to 490, at work then was 460 to 470 at work. He has not yet started arnuity.   He has never been a smoker, he has never had respiratory problems in the past, he does not have allergies, his allergy testing was negative. He does not have trouble during pollen season. He does not have a pet. He denies reflux, denies sinus drainage.   Imaging personally reviewed, chest x-ray 10/06/14; normal lungs.   CBC 03/01/17, absolute eosinophils 100.  OV 04/24/17 I had a discussion with Jacob today about his persistent symptoms at work.  He notes continued symptoms, and comes in today because his HR department would like to speak with me in regards to how to reduce his symptoms.  Therefore in the patient's presence and at his request I called the HR department at [phone], and spoke with him via speaker phone and patient's presence.  HR asked for clarification on  what the patient needs to reduce his symptoms.  I instructed them that the patient needs a well ventilated area so that he does not have symptoms, he would also be desirable for the patient to be placed in areas where there is not renovation ongoing, or recently which seems to be the issue which causes his asthma symptoms.  These asthma symptoms can be triggered by fumes, and can persist for weeks, or possibly months.  I explained that I am also now starting the patient on treatment, and I am hopeful that his symptoms will resolve or at least significantly improved.  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    Medication:    Current Outpatient Medications:  .  albuterol (PROAIR HFA) 108 (90 Base) MCG/ACT inhaler, Inhale 1-2 puffs into the lungs every 4 (four) hours as needed for wheezing or shortness of breath., Disp: 1 Inhaler, Rfl: 5 .  AMBULATORY NON FORMULARY MEDICATION, Medication Name: Peak flow meter. Use once every morning and once every afternoon. DX:J45.41, Disp: 1 each, Rfl: 0 .  Coenzyme Q10 (COQ10) 400 MG CAPS, Take 400 mg by mouth daily., Disp: , Rfl:  .  Cyanocobalamin (B-12 PO), Take 1,875 mcg  by mouth daily. , Disp: , Rfl:  .  docusate sodium (COLACE) 100 MG capsule, Take 100 mg by mouth daily as needed for mild constipation., Disp: , Rfl:  .  Fluticasone Furoate 200 MCG/ACT AEPB, Inhale 1 puff into the lungs daily., Disp: 30 each, Rfl: 5 .  lamoTRIgine (LAMICTAL) 100 MG tablet, Take 100 mg by mouth daily., Disp: , Rfl: 1 .  MAGNESIUM LACTATE PO, Take 84 mg by mouth every evening. , Disp: , Rfl:  .  NON FORMULARY, Take 0.5 tablets by mouth daily. Folate, methylated b12, glucosamine complex, Disp: , Rfl:  .  Riboflavin (VITAMIN B2 PO), Take 400 mg  by mouth daily. , Disp: , Rfl:    Allergies:  Lansoprazole  Review of Systems: Gen:  Denies  fever, sweats, chills HEENT: Denies blurred vision, double vision. bleeds, sore throat Cvc:  No dizziness, chest pain. Resp:   Denies cough or sputum production, shortness of breath Gi: Denies swallowing difficulty, stomach pain. Gu:  Denies bladder incontinence, burning urine Ext:   No Joint pain, stiffness. Skin: No skin rash,  hives  Endoc:  No polyuria, polydipsia. Psych: No depression, insomnia. Other:  All other systems were reviewed with the patient and were negative other that what is mentioned in the HPI.   Physical Examination:   VS: There were no vitals taken for this visit.  General Appearance: No distress  Neuro:without focal findings,  speech normal,  HEENT: PERRLA, EOM intact.   Pulmonary: normal breath sounds, No wheezing.  CardiovascularNormal S1,S2.  No m/r/g.   Abdomen: Benign, Soft, non-tender. Renal:  No costovertebral tenderness  GU:  No performed at this time. Endoc: No evident thyromegaly, no signs of acromegaly. Skin:   warm, no rashes, no ecchymosis  Extremities: normal, no cyanosis, clubbing.  Other findings:    LABORATORY PANEL:   CBC No results for input(s): WBC, HGB, HCT, PLT in the last 168 hours. ------------------------------------------------------------------------------------------------------------------  Chemistries  No results for input(s): NA, K, CL, CO2, GLUCOSE, BUN, CREATININE, CALCIUM, MG, AST, ALT, ALKPHOS, BILITOT in the last 168 hours.  Invalid input(s): GFRCGP ------------------------------------------------------------------------------------------------------------------  Cardiac Enzymes No results for input(s): TROPONINI in the last 168 hours. ------------------------------------------------------------  RADIOLOGY:  No results found.     Thank  you for the consultation and for [facility] to assist in the care of your patient. Our recommendations are noted above.  Please contact us if we can be of further service.   Wells Guileseep Lizania Bouchard, MD.  Board Certified in Internal Medicine, Pulmonary Medicine, Critical Care Medicine, and Sleep Medicine.  [place] [facility] Office Number: [phone]  Santiago Gladavid Kasa, M.D.  Billy Fischeravid Simonds, M.D  05/21/2017

## 2017-05-22 ENCOUNTER — Encounter: Payer: Self-pay | Admitting: Internal Medicine

## 2017-05-22 ENCOUNTER — Ambulatory Visit: Payer: BLUE CROSS/BLUE SHIELD | Admitting: Internal Medicine

## 2017-05-22 ENCOUNTER — Ambulatory Visit
Admission: RE | Admit: 2017-05-22 | Discharge: 2017-05-22 | Disposition: A | Discharge status: Home / Self Care | Payer: BLUE CROSS/BLUE SHIELD | Source: Ambulatory Visit | Attending: Internal Medicine | Admitting: Internal Medicine

## 2017-05-22 VITALS — BP 100/70 | HR 70 | Resp 16 | Ht 71.4 in | Wt 168.0 lb

## 2017-05-22 DIAGNOSIS — Z578 Occupational exposure to other risk factors: Secondary | ICD-10-CM

## 2017-05-22 DIAGNOSIS — R0609 Other forms of dyspnea: Secondary | ICD-10-CM | POA: Insufficient documentation

## 2017-05-22 DIAGNOSIS — Z579 Occupational exposure to unspecified risk factor: Secondary | ICD-10-CM

## 2017-05-22 DIAGNOSIS — J4541 Moderate persistent asthma with (acute) exacerbation: Secondary | ICD-10-CM | POA: Insufficient documentation

## 2017-05-22 MED ORDER — FLUTICASONE FUROATE 100 MCG/ACT IN AEPB: 1.0000 | INHALATION_SPRAY | Freq: Every day | RESPIRATORY_TRACT | 3 refills | Status: DC

## 2017-05-22 NOTE — Progress Notes (Signed)
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  -Improvement in patient's symptoms with Arnuity 200 once daily. -We will step down to Arnuity 100 once daily.  Asked to continue using. -Discussed relevant issues with occupational exposures at work, and continue tracking of peak flows and symptoms.  Meds ordered this encounter  Medications  . Fluticasone Furoate (ARNUITY ELLIPTA) 100 MCG/ACT AEPB    Sig: Inhale 1 puff into the lungs daily.    Dispense:  30 each    Refill:  3       Date: 05/22/2017  MRN# [id number] Jacob Malone 04-Apr-1965    Jacob Malone is a 52 y.o. old male seen in consultation for chief complaint of:    Chief Complaint  Patient presents with  . Follow-up    pt was started on Arnuity and rescue inhaler for prn: pt has not been in workplace since 04/30/17  . Shortness of Breath    improved on Arnuity but has not had to use rescue inhaler.    HPI:  Patient is a 52 year old male with a diagnosis of occupational asthma with mainly trouble when exposed to pain and other renovations going on at work.   He has been using arnuity inhaler once daily and rinsing mouth after using it. He notices an improvement  Where he is able to take a deep breath and his breathing has overall improved. He notes that his peak flows are now just above 500. He has not had to use albuterol.   He has never been a smoker, he has never had respiratory problems in the past, he does not have allergies, his allergy testing was negative. He does not have trouble during pollen season. He does not have a pet. He denies reflux, denies sinus drainage.   Imaging personally reviewed, chest x-ray 05/22/17 in comparison with previous 10/06/14; normal lungs.   CBC 03/01/17, absolute eosinophils 100.  OV 04/24/17 I had a discussion with Jacob today about his persistent symptoms at work.  He notes continued symptoms, and comes in  today because his HR department would like to speak with me in regards to how to reduce his symptoms.  Therefore in the patient's presence and at his request I called the HR department at [phone], and spoke with him via speaker phone and patient's presence.  HR asked for clarification on what the patient needs to reduce his symptoms.  I instructed them that the patient needs a well ventilated area so that he does not have symptoms, he would also be desirable for the patient to be placed in areas where there is not renovation ongoing, or recently which seems to be the issue which causes his asthma symptoms.  These asthma symptoms can be triggered by fumes, and can persist for weeks, or possibly months.  I explained that I am also now starting the patient on treatment, and I am hopeful that his symptoms will resolve or at least significantly improved.  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    Medication:    Current Outpatient Medications:  .  albuterol (PROAIR HFA) 108 (90 Base) MCG/ACT inhaler, Inhale 1-2 puffs into the  lungs every 4 (four) hours as needed for wheezing or shortness of breath., Disp: 1 Inhaler, Rfl: 5 .  AMBULATORY NON FORMULARY MEDICATION, Medication Name: Peak flow meter. Use once every morning and once every afternoon. DX:J45.41, Disp: 1 each, Rfl: 0 .  Coenzyme Q10 (COQ10) 400 MG CAPS, Take 400 mg by mouth daily., Disp: , Rfl:  .  Cyanocobalamin (B-12 PO), Take 1,875 mcg by mouth daily. , Disp: , Rfl:  .  docusate sodium (COLACE) 100 MG capsule, Take 100 mg by mouth daily as needed for mild constipation., Disp: , Rfl:  .  Fluticasone Furoate 200 MCG/ACT AEPB, Inhale 1 puff into the lungs daily., Disp: 30 each, Rfl: 5 .  lamoTRIgine  (LAMICTAL) 100 MG tablet, Take 100 mg by mouth daily., Disp: , Rfl: 1 .  MAGNESIUM LACTATE PO, Take 84 mg by mouth every evening. , Disp: , Rfl:  .  NON FORMULARY, Take 0.5 tablets by mouth daily. Folate, methylated b12, glucosamine complex, Disp: , Rfl:  .  Riboflavin (VITAMIN B2 PO), Take 400 mg by mouth daily. , Disp: , Rfl:    Allergies:  Lansoprazole  Review of Systems: Gen:  Denies  fever, sweats, chills HEENT: Denies blurred vision, double vision. bleeds, sore throat Cvc:  No dizziness, chest pain. Resp:   Denies cough or sputum production, shortness of breath Gi: Denies swallowing difficulty, stomach pain. Gu:  Denies bladder incontinence, burning urine Ext:   No Joint pain, stiffness. Skin: No skin rash,  hives  Endoc:  No polyuria, polydipsia. Psych: No depression. Other:  All other systems were reviewed with the patient and were negative other that what is mentioned in the HPI.   Physical Examination:   VS: BP 100/70 (BP Location: Left Arm, Cuff Size: Normal)   Pulse 70   Resp 16   Ht 5' 11.4" (1.814 m)   Wt 168 lb (76.2 kg)   SpO2 98%   BMI 23.17 kg/m   General Appearance: No distress  Neuro:without focal findings,  speech normal,  HEENT: PERRLA, EOM intact.   Pulmonary: normal breath sounds, No wheezing.  CardiovascularNormal S1,S2.  No m/r/g.   Abdomen: Benign, Soft, non-tender. Renal:  No costovertebral tenderness  GU:  No performed at this time. Endoc: No evident thyromegaly, no signs of acromegaly. Skin:   warm, no rashes, no ecchymosis  Extremities: normal, no cyanosis, clubbing.  Other findings:    LABORATORY PANEL:   CBC No results for input(s): WBC, HGB, HCT, PLT in the last 168 hours. ------------------------------------------------------------------------------------------------------------------  Chemistries  No results for input(s): NA, K, CL, CO2, GLUCOSE, BUN, CREATININE, CALCIUM, MG, AST, ALT, ALKPHOS, BILITOT in the last 168  hours.  Invalid input(s): GFRCGP ------------------------------------------------------------------------------------------------------------------  Cardiac Enzymes No results for input(s): TROPONINI in the last 168 hours. ------------------------------------------------------------  RADIOLOGY:  Dg Chest 2 View  Result Date: 05/22/2017 CLINICAL DATA:  Shortness of breath. EXAM: CHEST - 2 VIEW COMPARISON:  Radiographs of October 06, 2014. FINDINGS: The heart size and mediastinal contours are within normal limits. Both lungs are clear. No pneumothorax or pleural effusion is noted. The visualized skeletal structures are unremarkable. IMPRESSION: No active cardiopulmonary disease. Electronically Signed   By: Lupita RaiderJames  Green Jr, M.D.   On: 05/22/2017 09:40       Thank  you for the consultation and for allowing [facility] to assist in the care of your patient. Our recommendations are noted above.  Please contact us if we can be of further service.  Wells Guiles, MD.  Board Certified in Internal Medicine, Pulmonary Medicine, Critical Care Medicine, and Sleep Medicine.  [place] [facility] Office Number: [phone]  Santiago Glad, M.D.  Billy Fischer, M.D  05/22/2017

## 2017-05-22 NOTE — Patient Instructions (Addendum)
Continue using arnuity inhaler once daily. Will decrease to Arnuity 100.

## 2017-06-08 ENCOUNTER — Telehealth: Payer: Self-pay | Admitting: Internal Medicine

## 2017-06-08 NOTE — Telephone Encounter (Signed)
Patient brought in fmla forms to be completed and SENT BACK TO HIM.  Signed Release and mailed both forms to [employer] via interoffice mail.

## 2017-06-28 NOTE — Telephone Encounter (Signed)
Informed patient we have not received form yet from [employer]. Pt wanted to know turn around time once received. I told him they are usually signed the first day. Nothing further needed.

## 2017-06-28 NOTE — Telephone Encounter (Addendum)
Patient calling to check on status of forms .   Please call to let him know if these have been received back from [employer].

## 2017-07-04 NOTE — Telephone Encounter (Signed)
Patient calling on status of [employer] forms

## 2017-07-04 NOTE — Telephone Encounter (Signed)
Left message for Marcelino Duster at [facility] to return call re FMLA forms.

## 2017-07-05 NOTE — Telephone Encounter (Signed)
LMTCB  [employer] has fax requested FMLA paperwork. We did not receive paperwork as they stated it was put in the mail.

## 2017-07-05 NOTE — Telephone Encounter (Signed)
Patient aware paperwork in provider folder for signature. We will then fax back to [employer]. Nothing further needed.

## 2017-08-05 ENCOUNTER — Emergency Department (HOSPITAL_COMMUNITY): Payer: BLUE CROSS/BLUE SHIELD

## 2017-08-05 ENCOUNTER — Emergency Department (HOSPITAL_COMMUNITY)
Admission: EM | Admit: 2017-08-05 | Discharge: 2017-08-05 | Disposition: A | Discharge status: Home / Self Care | Payer: BLUE CROSS/BLUE SHIELD | Attending: Emergency Medicine | Admitting: Emergency Medicine

## 2017-08-05 ENCOUNTER — Other Ambulatory Visit: Payer: Self-pay

## 2017-08-05 DIAGNOSIS — Z79899 Other long term (current) drug therapy: Secondary | ICD-10-CM | POA: Diagnosis not present

## 2017-08-05 DIAGNOSIS — R1030 Lower abdominal pain, unspecified: Secondary | ICD-10-CM | POA: Diagnosis present

## 2017-08-05 DIAGNOSIS — N201 Calculus of ureter: Secondary | ICD-10-CM

## 2017-08-05 LAB — CBC
HEMATOCRIT: 40.2 % (ref 39.0–52.0)
HEMOGLOBIN: 13.1 g/dL (ref 13.0–17.0)
MCH: 29.9 pg (ref 26.0–34.0)
MCHC: 32.6 g/dL (ref 30.0–36.0)
MCV: 91.8 fL (ref 78.0–100.0)
Platelets: 211 10*3/uL (ref 150–400)
RBC: 4.38 MIL/uL (ref 4.22–5.81)
RDW: 12.1 % (ref 11.5–15.5)
WBC: 3.9 10*3/uL — AB (ref 4.0–10.5)

## 2017-08-05 LAB — URINALYSIS, ROUTINE W REFLEX MICROSCOPIC
BACTERIA UA: NONE SEEN
BILIRUBIN URINE: NEGATIVE
Glucose, UA: NEGATIVE mg/dL
Ketones, ur: NEGATIVE mg/dL
LEUKOCYTES UA: NEGATIVE
Nitrite: NEGATIVE
PH: 6 (ref 5.0–8.0)
Protein, ur: NEGATIVE mg/dL
RBC / HPF: >50 RBC/hpf — ABNORMAL HIGH (ref 0–5)
Specific Gravity, Urine: 1.016 (ref 1.005–1.030)

## 2017-08-05 LAB — COMPREHENSIVE METABOLIC PANEL
ALT: 16 U/L (ref 0–44)
AST: 21 U/L (ref 15–41)
Albumin: 4.1 g/dL (ref 3.5–5.0)
Alkaline Phosphatase: 60 U/L (ref 38–126)
Anion gap: 7 (ref 5–15)
BUN: 19 mg/dL (ref 6–20)
CHLORIDE: 105 mmol/L (ref 98–111)
CO2: 26 mmol/L (ref 22–32)
CREATININE: 1.25 mg/dL — AB (ref 0.61–1.24)
Calcium: 9.3 mg/dL (ref 8.9–10.3)
Glucose, Bld: 98 mg/dL (ref 70–99)
POTASSIUM: 4 mmol/L (ref 3.5–5.1)
SODIUM: 138 mmol/L (ref 135–145)
Total Bilirubin: 0.6 mg/dL (ref 0.3–1.2)
Total Protein: 7.2 g/dL (ref 6.5–8.1)

## 2017-08-05 LAB — LIPASE, BLOOD: LIPASE: 34 U/L (ref 11–51)

## 2017-08-05 MED ORDER — TAMSULOSIN HCL 0.4 MG PO CAPS: 0.4000 mg | ORAL_CAPSULE | Freq: Every day | ORAL | 0 refills | Status: DC

## 2017-08-05 MED ORDER — SODIUM CHLORIDE 0.9 % IV BOLUS: 1000.0000 mL | Freq: Once | INTRAVENOUS | Status: DC

## 2017-08-05 MED ORDER — HYDROCODONE-ACETAMINOPHEN 5-325 MG PO TABS: 1.0000 | ORAL_TABLET | Freq: Four times a day (QID) | ORAL | 0 refills | Status: DC | PRN

## 2017-08-05 MED ORDER — KETOROLAC TROMETHAMINE 30 MG/ML IJ SOLN
30.0000 mg | Freq: Once | INTRAMUSCULAR | Status: DC
  Filled 2017-08-05: qty 1

## 2017-08-05 NOTE — ED Provider Notes (Signed)
[facility] EMERGENCY DEPARTMENT Provider Note   CSN: [id number] Arrival date & time: 08/05/17  0203     History   Chief Complaint Chief Complaint  Patient presents with  . Abdominal Pain    HPI Jacob Malone is a 52 y.o. male.  HPI Patient presents to the emergency department with lower abdominal pain over the bladder.  The patient states that this started suddenly last night and he also has blood in his urine.  Patient states that nothing seems to make the condition better or worse.  The patient denies chest pain, shortness of breath, headache,blurred vision, neck pain, fever, cough, weakness, numbness, dizziness, anorexia, edema,  nausea, vomiting, diarrhea, rash, back pain, dysuria, hematemesis, bloody stool, near syncope, or syncope. Past Medical History:  Diagnosis Date  . Chicken pox   . Colon polyp    sessile - lost in stool  . Headache, chronic daily 2013   d/t lumbar puncture   . Hemorrhoids   . Iron deficiency anemia   . Migrainous dizziness 2012    Patient Active Problem List   Diagnosis Date Noted  . Contracture of palmar fascia 02/21/2017  . Pain 02/21/2017  . Primary osteoarthritis of first carpometacarpal joint of right hand 02/21/2017  . Discoloration of skin 01/20/2015  . RLQ abdominal pain 05/06/2014  . 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 Dizziness 10/02/2013  . Adhesive capsulitis of right shoulder 12/18/2011  . Shoulder pain 10/20/2011  . 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 Migraine with vertigo 05/23/2011  . Reflux 05/23/2011  . FOOT PAIN, BILATERAL 08/21/2008    Past Surgical History:  Procedure Laterality Date  . HEMORROIDECTOMY  2011, 2012  . NASAL SEPTUM SURGERY          Home Medications    Prior to Admission medications   Medication Sig Start Date End Date Taking?  Authorizing Provider  albuterol (PROAIR HFA) 108 (90 Base) MCG/ACT inhaler Inhale 1-2 puffs into the lungs every 4 (four) hours as needed for wheezing or shortness of breath. 04/11/17   Shane Crutchamachandran, Pradeep, MD  AMBULATORY NON FORMULARY MEDICATION Medication Name: Peak flow meter. Use once every morning and once every afternoon. DX:J45.41 04/11/17   Shane Crutchamachandran, Pradeep, MD  Coenzyme Q10 (COQ10) 400 MG CAPS Take 400 mg by mouth daily.    [provider]  Cyanocobalamin (B-12 PO) Take 1,875 mcg by mouth daily.     [provider]  docusate sodium (COLACE) 100 MG capsule Take 100 mg by mouth daily as needed for mild constipation.    [provider]  Fluticasone Furoate (ARNUITY ELLIPTA) 100 MCG/ACT AEPB Inhale 1 puff into the lungs daily. 05/22/17   Shane Crutchamachandran, Pradeep, MD  Fluticasone Furoate 200 MCG/ACT AEPB Inhale 1 puff into the lungs daily. 04/11/17   Shane Crutchamachandran, Pradeep, MD  lamoTRIgine (LAMICTAL) 100 MG tablet Take 100 mg by mouth daily. 04/22/14   [provider]  MAGNESIUM LACTATE PO Take 84 mg by mouth every evening.     [provider]  NON FORMULARY Take 0.5 tablets by mouth daily. Folate, methylated b12, glucosamine complex    [provider]  Riboflavin (VITAMIN B2 PO) Take 400 mg by mouth daily.     [provider]    Family History Family History  Problem Relation Age of Onset  . Alcohol abuse Father   . Diabetes Father   .  Cancer Father        Lung  . Cancer Paternal Grandfather        Abdominal Cancer    Social History Social History   Tobacco Use  . Smoking status: Never Smoker  . Smokeless tobacco: Never Used  Substance Use Topics  . Alcohol use: No  . Drug use: No     Allergies   Lansoprazole   Review of Systems Review of Systems All other systems negative except as documented in the HPI. All pertinent positives and negatives as reviewed in the HPI.  Physical Exam Updated Vital Signs BP  98/82 (BP Location: Right Arm)   Pulse 81   Temp 98.5 F (36.9 C) (Oral)   Ht 5\' 11"  (1.803 m)   Wt 74.8 kg (165 lb)   SpO2 98%   BMI 23.01 kg/m   Physical Exam  Constitutional: He is oriented to person, place, and time. He appears well-developed and well-nourished. No distress.  HENT:  Head: Normocephalic and atraumatic.  Mouth/Throat: Oropharynx is clear and moist.  Eyes: Pupils are equal, round, and reactive to light.  Neck: Normal range of motion. Neck supple.  Cardiovascular: Normal rate, regular rhythm and normal heart sounds. Exam reveals no gallop and no friction rub.  No murmur heard. Pulmonary/Chest: Effort normal and breath sounds normal. No respiratory distress. He has no wheezes.  Abdominal: Soft. Bowel sounds are normal. He exhibits no distension. There is tenderness in the suprapubic area.  Neurological: He is alert and oriented to person, place, and time. He exhibits normal muscle tone. Coordination normal.  Skin: Skin is warm and dry. Capillary refill takes less than 2 seconds. No rash noted. No erythema.  Psychiatric: He has a normal mood and affect. His behavior is normal.  Nursing note and vitals reviewed.    ED Treatments / Results  Labs (all labs ordered are listed, but only abnormal results are displayed) Labs Reviewed  COMPREHENSIVE METABOLIC PANEL - Abnormal; Notable for the following components:      Result Value   Creatinine, Ser 1.25 (*)    All other components within normal limits  CBC - Abnormal; Notable for the following components:   WBC 3.9 (*)    All other components within normal limits  URINALYSIS, ROUTINE W REFLEX MICROSCOPIC - Abnormal; Notable for the following components:   Hgb urine dipstick LARGE (*)    RBC / HPF >50 (*)    All other components within normal limits  LIPASE, BLOOD    EKG None  Radiology No results found.  Procedures Procedures (including critical care time)  Medications Ordered in ED Medications - No  data to display   Initial Impression / Assessment and Plan / ED Course  I have reviewed the triage vital signs and the nursing notes.  Pertinent labs & imaging results that were available during my care of the patient were reviewed by me and considered in my medical decision making (see chart for details).     Patient will be referred to [facility] urology.  The patient refuses having IV fluids and pain medications at this point.  I explained the need for close follow-up.  The patient agrees with plan and all questions were answered.  Did advise him to return for any worsening in his condition.  The patient states currently he has no pain. Final Clinical Impressions(s) / ED Diagnoses   Final diagnoses:  None    ED Discharge Orders    None  Charlestine Night, PA-C 08/05/17 Libby Maw    Linwood Dibbles, MD 08/05/17 1534

## 2017-08-05 NOTE — ED Notes (Signed)
Pt informed of discharge. Pt getting dressed

## 2017-08-05 NOTE — Discharge Instructions (Signed)
Return here as needed.  Follow up with the urologist provided.  Increase your fluid intake °

## 2017-08-05 NOTE — ED Triage Notes (Signed)
Patient c/o abdominal pain, and chills for a few hours. Patient states that he saw blood in his urine.

## 2017-09-03 ENCOUNTER — Ambulatory Visit (INDEPENDENT_AMBULATORY_CARE_PROVIDER_SITE_OTHER): Payer: BLUE CROSS/BLUE SHIELD | Admitting: Family Medicine

## 2017-09-03 ENCOUNTER — Encounter: Payer: Self-pay | Admitting: Family Medicine

## 2017-09-03 VITALS — BP 130/90 | HR 68 | Temp 97.8°F | Wt 171.8 lb

## 2017-09-03 DIAGNOSIS — L080 Pyoderma: Secondary | ICD-10-CM

## 2017-09-03 DIAGNOSIS — R232 Flushing: Secondary | ICD-10-CM | POA: Diagnosis not present

## 2017-09-03 MED ORDER — DOXYCYCLINE HYCLATE 100 MG PO CAPS: 100.0000 mg | ORAL_CAPSULE | Freq: Two times a day (BID) | ORAL | 0 refills | Status: DC

## 2017-09-03 NOTE — Progress Notes (Signed)
Subjective:     Patient ID: Jacob Malone, male   DOB: 07-May-1965, 52 y.o.   MRN: [id number]  HPI Patient seen with complaint of three-week history of face feeling flushed intermittently. He states he was treated briefly with Flomax for 5 days (3 weeks ago) and he had read flushing was a potential side effect. However, his symptoms have persisted even after stopping the Flomax. He does take Lamictal and hot flashes are listed as potential side effect with that drug but he has been on that for several years. Denies recent fever. No alcohol use. No dietary changes. No niacin use. No clear triggering factors. Normal TSH back in January  He does not have any abdominal pain, bronchospasm, diarrhea, etc. to suggest carcinoid syndrome. No recent hypotension, tachycardia, abdominal pain, nausea, vomiting, or diarrhea to suggest likely systemic mastocytosis  Denies any headache, tachycardia, or hypertension or diaphoresis to suggest likely pheochromocytoma.  He also has had some mild facial rash. Small erythematous papules mostly on the forehead but also central face on nose. No history of rosacea. No family history of rosacea. Denies any generalized rash. Patient took some diphenhydramine for facial rash without improvement.  Past Medical History:  Diagnosis Date  . Chicken pox   . Colon polyp    sessile - lost in stool  . Headache, chronic daily 2013   d/t lumbar puncture   . Hemorrhoids   . Iron deficiency anemia   . Migrainous dizziness 2012   Past Surgical History:  Procedure Laterality Date  . HEMORROIDECTOMY  2011, 2012  . NASAL SEPTUM SURGERY      reports that he has never smoked. He has never used smokeless tobacco. He reports that he does not drink alcohol or use drugs. family history includes Alcohol abuse in his father; Cancer in his father and paternal grandfather; Diabetes in his father. Allergies  Allergen Reactions  . Lansoprazole Other (See Comments)    "Did not feel  right"     Review of Systems  Constitutional: Negative for chills, fatigue and fever.  Respiratory: Negative for shortness of breath.   Cardiovascular: Negative for chest pain.  Gastrointestinal: Negative for abdominal pain, constipation, diarrhea, nausea and vomiting.  Endocrine: Negative for cold intolerance, heat intolerance, polydipsia and polyuria.  Skin: Positive for rash.  Neurological: Negative for headaches.       Objective:   Physical Exam  Constitutional: He appears well-developed and well-nourished.  Neck: Neck supple. No thyromegaly present.  Cardiovascular: Normal rate and regular rhythm.  Pulmonary/Chest: Effort normal and breath sounds normal. No respiratory distress. He has no wheezes. He has no rales.  Musculoskeletal: He exhibits no edema.  Lymphadenopathy:    He has no cervical adenopathy.  Skin: Rash noted.  Patient has very small pinpoint erythematous papules mostly on the forehead with some scattered on the face including left side of nose. Several these have a very small pustular type center. No scaly rash       Assessment:     #1 three-week history of intermittent facial flushing. He was on Flomax but has been off this now for a couple weeks and we explained this should not be causing prolonged symptoms. He is on Lamictal and this is listed as a potential side effect but has been on this medication for years which would seem unlikely. He does not have any other symptoms to suggest likely carcinoid syndrome, systemic mastocytosis, or pheochromocytoma.  #2 facial rash which just came up about 3 weeks ago  as well. He has small erythematous papules and a few pustules.  No history of rosacea but rash resembles potential rosacea type rash    Plan:     -Consider doxycycline 100 mg twice a day for 10 days with caution for sun sensitivity -Follow-up promptly for any diarrhea, fever, abdominal pain, etc. -Pay close attention to diet to look for any potential  triggering issues with his hot flashes. -Consider further laboratory workup if symptoms persist  Kristian Covey MD [facility]

## 2017-09-03 NOTE — Patient Instructions (Signed)
Try the Doxycycline if rash persists- but caution for sun sensitivity as discussed.

## 2017-09-14 ENCOUNTER — Encounter: Payer: Self-pay | Admitting: Family Medicine

## 2017-09-14 ENCOUNTER — Ambulatory Visit (INDEPENDENT_AMBULATORY_CARE_PROVIDER_SITE_OTHER): Payer: BLUE CROSS/BLUE SHIELD | Admitting: Family Medicine

## 2017-09-14 VITALS — BP 98/76 | HR 86 | Temp 98.1°F | Ht 71.0 in | Wt 170.0 lb

## 2017-09-14 DIAGNOSIS — R490 Dysphonia: Secondary | ICD-10-CM | POA: Diagnosis not present

## 2017-09-14 DIAGNOSIS — R5383 Other fatigue: Secondary | ICD-10-CM | POA: Diagnosis not present

## 2017-09-14 DIAGNOSIS — I959 Hypotension, unspecified: Secondary | ICD-10-CM

## 2017-09-14 NOTE — Patient Instructions (Addendum)
Hypotension Hypotension, commonly called low blood pressure, is when the force of blood pumping through your arteries is too weak. Arteries are blood vessels that carry blood from the heart throughout the body. When blood pressure is too low, you may not get enough blood to your brain or to the rest of your organs. This can cause weakness, light-headedness, rapid heartbeat, and fainting. Depending on the cause and severity, hypotension may be harmless (benign) or cause serious problems (critical). What are the causes? Possible causes of hypotension include:  Blood loss.  Loss of body fluids (dehydration).  Heart problems.  Hormone (endocrine) problems.  Pregnancy.  Severe infection.  Lack of certain nutrients.  Severe allergic reactions (anaphylaxis).  Certain medicines, such as blood pressure medicine or medicines that make the body lose excess fluids (diuretics). Sometimes, hypotension can be caused by not taking medicine as directed, such as taking too much of a certain medicine.  What increases the risk? Certain factors can make you more likely to develop hypotension, including:  Age. Risk increases as you get older.  Conditions that affect the heart or the central nervous system.  Taking certain medicines, such as blood pressure medicine or diuretics.  Being pregnant.  What are the signs or symptoms? Symptoms of this condition may include:  Weakness.  Light-headedness.  Dizziness.  Blurred vision.  Fatigue.  Rapid heartbeat.  Fainting, in severe cases.  How is this diagnosed? This condition is diagnosed based on:  Your medical history.  Your symptoms.  Your blood pressure measurement. Your health care provider will check your blood pressure when you are: ? Lying down. ? Sitting. ? Standing.  A blood pressure reading is recorded as two numbers, such as "120 over 80" (or 120/80). The first ("top") number is called the systolic pressure. It is a  measure of the pressure in your arteries as your heart beats. The second ("bottom") number is called the diastolic pressure. It is a measure of the pressure in your arteries when your heart relaxes between beats. Blood pressure is measured in a unit called mm Hg. Healthy blood pressure for adults is 120/80. If your blood pressure is below 90/60, you may be diagnosed with hypotension. Other information or tests that may be used to diagnose hypotension include:  Your other vital signs, such as your heart rate and temperature.  Blood tests.  Tilt table test. For this test, you will be safely secured to a table that moves you from a lying position to an upright position. Your heart rhythm and blood pressure will be monitored during the test.  How is this treated? Treatment for this condition may include:  Changing your diet. This may involve eating more salt (sodium) or drinking more water.  Taking medicines to raise your blood pressure.  Changing the dosage of certain medicines you are taking that might be lowering your blood pressure.  Wearing compression stockings. These stockings help to prevent blood clots and reduce swelling in your legs.  In some cases, you may need to go to the hospital for:  Fluid replacement. This means you will receive fluids through an IV tube.  Blood replacement. This means you will receive donated blood through an IV tube (transfusion).  Treating an infection or heart problems, if this applies.  Monitoring. You may need to be monitored while medicines that you are taking wear off.  Follow these instructions at home: Eating and drinking   Drink enough fluid to keep your urine clear or pale yellow.  Eat a healthy diet and follow instructions from your health care provider about eating or drinking restrictions. A healthy diet includes: ? Fresh fruits and vegetables. ? Whole grains. ? Lean meats. ? Low-fat dairy products.  Eat extra salt only as  directed. Do not add extra salt to your diet unless your health care provider told you to do that.  Eat frequent, small meals.  Avoid standing up suddenly after eating. Medicines  Take over-the-counter and prescription medicines only as told by your health care provider. ? Follow instructions from your health care provider about changing the dosage of your current medicines, if this applies. ? Do not stop or adjust any of your medicines on your own. General instructions  Wear compression stockings as told by your health care provider.  Get up slowly from lying down or sitting positions. This gives your blood pressure a chance to adjust.  Avoid hot showers and excessive heat as directed by your health care provider.  Return to your normal activities as told by your health care provider. Ask your health care provider what activities are safe for you.  Do not use any products that contain nicotine or tobacco, such as cigarettes and e-cigarettes. If you need help quitting, ask your health care provider.  Keep all follow-up visits as told by your health care provider. This is important. Contact a health care provider if:  You vomit.  You have diarrhea.  You have a fever for more than 2-3 days.  You feel more thirsty than usual.  You feel weak and tired. Get help right away if:  You have chest pain.  You have a fast or irregular heartbeat.  You develop numbness in any part of your body.  You cannot move your arms or your legs.  You have trouble speaking.  You become sweaty or feel light-headed.  You faint.  You feel short of breath.  You have trouble staying awake.  You feel confused. This information is not intended to replace advice given to you by your health care provider. Make sure you discuss any questions you have with your health care provider. Document Released: 01/23/2005 Document Revised: 08/13/2015 Document Reviewed: 07/15/2015 Elsevier Interactive  Patient Education  2018 [employer]  Increase hydration  Salt food more liberally.

## 2017-09-14 NOTE — Progress Notes (Signed)
  Subjective:     Patient ID: Jacob Malone, male   DOB: 25-Dec-1965, 52 y.o.   MRN: [id number]  HPI Patient seen today for the following symptoms  He complains of some hoarseness for about a week. No sore throat. No postnasal drip symptoms. Occasionally clears throat. No obvious GERD symptoms. Nonsmoker. No clear exacerbating or alleviating factors.  Patient complains of some lightheadedness and weakness with past week or so. Worse with standing. No syncope. Generally has fairly low blood pressure in the 90s to 100 systolic. Recent hemoglobin 13.1. He's also had some nonspecific malaise. No fever.  Past Medical History:  Diagnosis Date  . Chicken pox   . Colon polyp    sessile - lost in stool  . Headache, chronic daily 2013   d/t lumbar puncture   . Hemorrhoids   . Iron deficiency anemia   . Migrainous dizziness 2012   Past Surgical History:  Procedure Laterality Date  . HEMORROIDECTOMY  2011, 2012  . NASAL SEPTUM SURGERY      reports that he has never smoked. He has never used smokeless tobacco. He reports that he does not drink alcohol or use drugs. family history includes Alcohol abuse in his father; Cancer in his father and paternal grandfather; Diabetes in his father. Allergies  Allergen Reactions  . Lansoprazole Other (See Comments)    "Did not feel right"     Review of Systems  Constitutional: Positive for fatigue. Negative for appetite change, chills, fever and unexpected weight change.  HENT: Positive for voice change. Negative for postnasal drip, sore throat and trouble swallowing.   Respiratory: Negative for cough and shortness of breath.   Cardiovascular: Negative for chest pain and leg swelling.  Neurological: Positive for dizziness and light-headedness. Negative for syncope.       Objective:   Physical Exam  Constitutional: He is oriented to person, place, and time. He appears well-developed and well-nourished.  HENT:  Mouth/Throat: Oropharynx is clear  and moist.  Cardiovascular: Normal rate and regular rhythm.  No murmur heard. Pulmonary/Chest: Effort normal and breath sounds normal.  Neurological: He is alert and oriented to person, place, and time. No cranial nerve deficit.       Assessment:     #1 one week history of hoarseness. Nonfocal exam. Went over differential of viral versus silent GERD most likely. No postnasal drip symptoms.  #2 patient seen with some lightheadedness. He does have confirmed blood pressure which is on the low side of 84/60 seated and 86/60 standing. Etiology  unclear. He does seem to be drinking adequate fluids. No evidence for anemia.  #3 nonspecific symptoms of fatigue. Recent TSH normal      Plan:     -Patient will return early morning on Monday for further labs with basic metabolic panel and a.m. cortisol level. He requests vitamin D level. He gets very little sun exposure and has had low levels previously -Regarding his hoarseness we've recommended observing for now. Touch base if not resolving over next couple weeks  Jacob CoveyBruce W Burchette MD [facility]

## 2017-09-17 ENCOUNTER — Other Ambulatory Visit (INDEPENDENT_AMBULATORY_CARE_PROVIDER_SITE_OTHER): Payer: BLUE CROSS/BLUE SHIELD

## 2017-09-17 DIAGNOSIS — R5383 Other fatigue: Secondary | ICD-10-CM

## 2017-09-17 DIAGNOSIS — I959 Hypotension, unspecified: Secondary | ICD-10-CM | POA: Diagnosis not present

## 2017-09-17 LAB — BASIC METABOLIC PANEL
BUN: 18 mg/dL (ref 6–23)
CALCIUM: 9.9 mg/dL (ref 8.4–10.5)
CO2: 29 mEq/L (ref 19–32)
Chloride: 101 mEq/L (ref 96–112)
Creatinine, Ser: 1.25 mg/dL (ref 0.40–1.50)
GFR: 65.05 mL/min (ref >60.00)
GLUCOSE: 87 mg/dL (ref 70–99)
Potassium: 4.1 mEq/L (ref 3.5–5.1)
SODIUM: 138 meq/L (ref 135–145)

## 2017-09-17 LAB — CORTISOL: Cortisol, Plasma: 6.1 ug/dL

## 2017-09-17 LAB — VITAMIN D 25 HYDROXY (VIT D DEFICIENCY, FRACTURES): VITD: 35.63 ng/mL (ref 30.00–100.00)

## 2017-11-23 ENCOUNTER — Emergency Department (HOSPITAL_COMMUNITY)
Admission: EM | Admit: 2017-11-23 | Discharge: 2017-11-23 | Disposition: A | Discharge status: Home / Self Care | Payer: No Typology Code available for payment source | Attending: Emergency Medicine | Admitting: Emergency Medicine

## 2017-11-23 ENCOUNTER — Encounter (HOSPITAL_COMMUNITY): Payer: Self-pay | Admitting: *Deleted

## 2017-11-23 ENCOUNTER — Emergency Department (HOSPITAL_COMMUNITY): Payer: No Typology Code available for payment source

## 2017-11-23 DIAGNOSIS — R200 Anesthesia of skin: Secondary | ICD-10-CM | POA: Diagnosis present

## 2017-11-23 DIAGNOSIS — R202 Paresthesia of skin: Secondary | ICD-10-CM | POA: Insufficient documentation

## 2017-11-23 DIAGNOSIS — Z79899 Other long term (current) drug therapy: Secondary | ICD-10-CM | POA: Diagnosis not present

## 2017-11-23 LAB — I-STAT CHEM 8, ED
BUN: 18 mg/dL (ref 6–20)
CHLORIDE: 102 mmol/L (ref 98–111)
CREATININE: 1.4 mg/dL — AB (ref 0.61–1.24)
Calcium, Ion: 1.14 mmol/L — ABNORMAL LOW (ref 1.15–1.40)
Glucose, Bld: 105 mg/dL — ABNORMAL HIGH (ref 70–99)
HEMATOCRIT: 43 % (ref 39.0–52.0)
Hemoglobin: 14.6 g/dL (ref 13.0–17.0)
Potassium: 3.4 mmol/L — ABNORMAL LOW (ref 3.5–5.1)
SODIUM: 139 mmol/L (ref 135–145)
TCO2: 25 mmol/L (ref 22–32)

## 2017-11-23 LAB — CBC
HEMATOCRIT: 43.1 % (ref 39.0–52.0)
HEMOGLOBIN: 14.4 g/dL (ref 13.0–17.0)
MCH: 29.9 pg (ref 26.0–34.0)
MCHC: 33.4 g/dL (ref 30.0–36.0)
MCV: 89.4 fL (ref 80.0–100.0)
NRBC: 0 % (ref 0.0–0.2)
Platelets: 227 10*3/uL (ref 150–400)
RBC: 4.82 MIL/uL (ref 4.22–5.81)
RDW: 11.9 % (ref 11.5–15.5)
WBC: 4.9 10*3/uL (ref 4.0–10.5)

## 2017-11-23 LAB — DIFFERENTIAL
Abs Immature Granulocytes: 0.01 10*3/uL (ref 0.00–0.07)
BASOS ABS: 0 10*3/uL (ref 0.0–0.1)
Basophils Relative: 0 %
Eosinophils Absolute: 0.1 10*3/uL (ref 0.0–0.5)
Eosinophils Relative: 1 %
Immature Granulocytes: 0 %
LYMPHS ABS: 1.7 10*3/uL (ref 0.7–4.0)
LYMPHS PCT: 35 %
Monocytes Absolute: 0.5 10*3/uL (ref 0.1–1.0)
Monocytes Relative: 9 %
NEUTROS ABS: 2.7 10*3/uL (ref 1.7–7.7)
Neutrophils Relative %: 55 %

## 2017-11-23 LAB — I-STAT TROPONIN, ED: Troponin i, poc: 0 ng/mL (ref 0.00–0.08)

## 2017-11-23 LAB — COMPREHENSIVE METABOLIC PANEL
ALK PHOS: 69 U/L (ref 38–126)
ALT: 17 U/L (ref 0–44)
ANION GAP: 9 (ref 5–15)
AST: 23 U/L (ref 15–41)
Albumin: 4.3 g/dL (ref 3.5–5.0)
BUN: 15 mg/dL (ref 6–20)
CALCIUM: 9.7 mg/dL (ref 8.9–10.3)
CO2: 25 mmol/L (ref 22–32)
Chloride: 103 mmol/L (ref 98–111)
Creatinine, Ser: 1.38 mg/dL — ABNORMAL HIGH (ref 0.61–1.24)
GFR calc Af Amer: >60 mL/min (ref >60)
GFR calc non Af Amer: 59 mL/min — ABNORMAL LOW (ref >60)
GLUCOSE: 111 mg/dL — AB (ref 70–99)
Potassium: 3.4 mmol/L — ABNORMAL LOW (ref 3.5–5.1)
SODIUM: 137 mmol/L (ref 135–145)
Total Bilirubin: 0.7 mg/dL (ref 0.3–1.2)
Total Protein: 7.4 g/dL (ref 6.5–8.1)

## 2017-11-23 LAB — CBG MONITORING, ED: Glucose-Capillary: 108 mg/dL — ABNORMAL HIGH (ref 70–99)

## 2017-11-23 LAB — APTT: aPTT: 33 seconds (ref 24–36)

## 2017-11-23 LAB — PROTIME-INR
INR: 1.03
Prothrombin Time: 13.4 seconds (ref 11.4–15.2)

## 2017-11-23 MED ORDER — SODIUM CHLORIDE 0.9 % IV BOLUS
1000.0000 mL | Freq: Once | INTRAVENOUS | Status: AC
  Administered 2017-11-23: 1000 mL via INTRAVENOUS

## 2017-11-23 NOTE — ED Provider Notes (Signed)
[facility] EMERGENCY DEPARTMENT Provider Note   CSN: [id number] Arrival date & time: 11/23/17  2130     History   Chief Complaint Chief Complaint  Patient presents with  . Numbness    HPI Jacob Malone is a 52 y.o. male.  The history is provided by the patient. No language interpreter was used.     52 year old male with history of atypical migraine, chronic headache, sent here by his PCP for evaluation of headache.  Patient report 5 days ago he noticed left eye blurry vision.  The next day he also noticed blurry vision along with a pulling sensation to the left side of his face.  He initially went to the [place] for symptom, and was told that he did not have a stroke without any imaging performed.  He went home, symptoms still persist and he went to another  facility for evaluation.  He was told that he may have a mild case of Bell's palsy and was prescribed valacyclovir.  He did take 1 dose of medication but the next day he then experiencing pain to his left shoulder and arm with stinging sensation to his fingers.  He went to the [facility] today for his no symptoms and was recommended to come to ER.  At this time, he mentioned that her vision has since resolved.  He also mention been evaluated by an ophthalmologist during the prior visit, and was told that his vision was fine.  He does endorse chronic daily headache but it has not intensified.  He denies any associated fever, chills, diplopia, confusion, drooling from his mouth, neck pain, chest pain, trouble breathing, nausea vomiting diarrhea abdominal pain back pain or rash.  He denies any recent strenuous activities or heavy lifting.  Denies any new medication changes aside from the new acyclovir.   Past Medical History:  Diagnosis Date  . Chicken pox   . Colon polyp    sessile - lost in stool  . Headache, chronic daily 2013   d/t lumbar puncture   . Hemorrhoids   . Iron deficiency anemia   . Migrainous  dizziness 2012    Patient Active Problem List   Diagnosis Date Noted  . Contracture of palmar fascia 02/21/2017  . Pain 02/21/2017  . Primary osteoarthritis of first carpometacarpal joint of right hand 02/21/2017  . Discoloration of skin 01/20/2015  . RLQ abdominal pain 05/06/2014  . 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 Dizziness 10/02/2013  . Adhesive capsulitis of right shoulder 12/18/2011  . Shoulder pain 10/20/2011  . 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 Migraine with vertigo 05/23/2011  . Reflux 05/23/2011  . FOOT PAIN, BILATERAL 08/21/2008    Past Surgical History:  Procedure Laterality Date  . HEMORROIDECTOMY  2011, 2012  . NASAL SEPTUM SURGERY          Home Medications    Prior to Admission medications   Medication Sig Start Date End Date Taking? Authorizing Provider  albuterol (PROAIR HFA) 108 (90 Base) MCG/ACT inhaler Inhale 1-2 puffs into the lungs every 4 (four) hours as needed for wheezing or shortness of breath. 04/11/17   Shane Crutch, MD  AMBULATORY NON FORMULARY MEDICATION Medication Name: Peak flow meter. Use once every morning and once every afternoon. DX:J45.41 04/11/17   Shane Crutch, MD  Coenzyme Q10 (COQ10) 400 MG CAPS Take 400 mg by mouth daily.  [provider]  Cyanocobalamin (B-12 PO) Take 1,875 mcg by mouth daily.     [provider]  docusate sodium (COLACE) 100 MG capsule Take 100 mg by mouth daily as needed for mild constipation.    [provider]  doxycycline (VIBRAMYCIN) 100 MG capsule Take 1 capsule (100 mg total) by mouth 2 (two) times daily. 09/03/17   Burchette, Elberta Fortis, MD  Fluticasone Furoate (ARNUITY ELLIPTA) 100 MCG/ACT AEPB Inhale 1 puff into the lungs daily. 05/22/17   Shane Crutch, MD  lamoTRIgine (LAMICTAL) 100 MG tablet Take 100 mg by mouth  daily. 04/22/14   [provider]  MAGNESIUM LACTATE PO Take 84 mg by mouth every evening.     [provider]  NON FORMULARY Take 0.5 tablets by mouth daily. Folate, methylated b12, glucosamine complex    [provider]  Riboflavin (VITAMIN B2 PO) Take 400 mg by mouth daily.     [provider]    Family History Family History  Problem Relation Age of Onset  . Alcohol abuse Father   . Diabetes Father   . Cancer Father        Lung  . Cancer Paternal Grandfather        Abdominal Cancer    Social History Social History   Tobacco Use  . Smoking status: Never Smoker  . Smokeless tobacco: Never Used  Substance Use Topics  . Alcohol use: No  . Drug use: No     Allergies   Lansoprazole   Review of Systems Review of Systems  All other systems reviewed and are negative.    Physical Exam Updated Vital Signs BP 111/81 (BP Location: Right Arm)   Pulse 75   Temp 98 F (36.7 C) (Oral)   Resp 20   SpO2 100%   Physical Exam  Constitutional: He is oriented to person, place, and time. He appears well-developed and well-nourished. No distress.  HENT:  Head: Atraumatic.  Right Ear: External ear normal.  Left Ear: External ear normal.  Nose: Nose normal.  Mouth/Throat: Oropharynx is clear and moist.  Eyes: Pupils are equal, round, and reactive to light. Conjunctivae and EOM are normal.  Neck: Normal range of motion. Neck supple.  No nuchal rigidity  Cardiovascular: Normal rate and regular rhythm.  Pulmonary/Chest: Effort normal and breath sounds normal.  Abdominal: Soft. Bowel sounds are normal. He exhibits no distension. There is no tenderness.  Musculoskeletal: Normal range of motion.  Neurological: He is alert and oriented to person, place, and time. He has normal strength. No cranial nerve deficit or sensory deficit. He displays a negative Romberg sign. Coordination normal. GCS eye subscore is 4. GCS verbal subscore is 5. GCS motor  subscore is 6.  Neurologic exam:  Speech clear, pupils equal round reactive to light, extraocular movements intact  Normal peripheral visual fields Cranial nerves III through XII normal including no facial droop Follows commands, moves all extremities x4, normal strength to bilateral upper and lower extremities at all major muscle groups including grip Sensation normal to light touch  Coordination intact, no limb ataxia, finger-nose-finger normal No pronator drift   Skin: No rash noted.  Psychiatric: He has a normal mood and affect.  Nursing note and vitals reviewed.    ED Treatments / Results  Labs (all labs ordered are listed, but only abnormal results are displayed) Labs Reviewed  COMPREHENSIVE METABOLIC PANEL - Abnormal; Notable for the following components:      Result Value   Potassium  3.4 (*)    Glucose, Bld 111 (*)    Creatinine, Ser 1.38 (*)    GFR calc non Af Amer 59 (*)    All other components within normal limits  CBG MONITORING, ED - Abnormal; Notable for the following components:   Glucose-Capillary 108 (*)    All other components within normal limits  I-STAT CHEM 8, ED - Abnormal; Notable for the following components:   Potassium 3.4 (*)    Creatinine, Ser 1.40 (*)    Glucose, Bld 105 (*)    Calcium, Ion 1.14 (*)    All other components within normal limits  PROTIME-INR  APTT  CBC  DIFFERENTIAL  I-STAT TROPONIN, ED    EKG EKG Interpretation  Date/Time:  Friday November 23 2017 09:30:27 EDT Ventricular Rate:  70 PR Interval:  158 QRS Duration: 80 QT Interval:  380 QTC Calculation: 410 R Axis:   84 Text Interpretation:  Normal sinus rhythm Normal ECG Confirmed by Virgina Norfolk [phone]) on 11/23/2017 9:51:10 AM   Radiology Ct Head Wo Contrast  Result Date: 11/23/2017 CLINICAL DATA:  Left facial droop, blurred vision EXAM: CT HEAD WITHOUT CONTRAST TECHNIQUE: Contiguous axial images were obtained from the base of the skull through the vertex  without intravenous contrast. COMPARISON:  None. FINDINGS: Brain: No acute intracranial abnormality. Specifically, no hemorrhage, hydrocephalus, mass lesion, acute infarction, or significant intracranial injury. Vascular: No hyperdense vessel or unexpected calcification. Skull: No acute calvarial abnormality. Sinuses/Orbits: Visualized paranasal sinuses and mastoids clear. Orbital soft tissues unremarkable. Other: None IMPRESSION: Normal study. Electronically Signed   By: Charlett Nose M.D.   On: 11/23/2017 10:14   Mr Brain Wo Contrast  Result Date: 11/23/2017 CLINICAL DATA:  Left-sided facial droop, visual changes, left arm pain, numbness, and tingling. EXAM: MRI HEAD WITHOUT CONTRAST TECHNIQUE: Multiplanar, multiecho pulse sequences of the brain and surrounding structures were obtained without intravenous contrast. COMPARISON:  Head CT 11/23/2017 FINDINGS: Brain: There is no evidence of acute infarct, intracranial hemorrhage, mass, midline shift, or extra-axial fluid collection. The ventricles and sulci are normal. There are 10-15 small foci of T2 hyperintensity are scattered in the predominantly deep cerebral white matter. The periventricular white matter is largely spared. The corpus callosum, brainstem, and cerebellum are normal in signal. 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 There is also mild enlargement of the cisterna magna, a normal variant. Vascular: Major intracranial vascular flow voids are preserved. Skull and upper cervical spine: Unremarkable bone marrow signal. Sinuses/Orbits: Unremarkable orbits. Paranasal sinuses and mastoid air cells are clear. Other: None. IMPRESSION: 1. No acute intracranial abnormality. 2. Mild cerebral white matter T2 signal changes, abnormal for age and nonspecific. Considerations include early chronic small vessel ischemia, migraines, prior infection/inflammation, prior trauma, vasculitis, and  demyelinating disease. No lesions specifically suggestive of multiple sclerosis. Electronically Signed   By: Sebastian Ache M.D.   On: 11/23/2017 13:18    Procedures Procedures (including critical care time)  Medications Ordered in ED Medications  sodium chloride 0.9 % bolus 1,000 mL (0 mLs Intravenous Stopped 11/23/17 1213)     Initial Impression / Assessment and Plan / ED Course  I have reviewed the triage vital signs and the nursing notes.  Pertinent labs & imaging results that were available during my care of the patient were reviewed by me and considered in my medical decision making (see chart for details).     BP 124/80 (BP Location: Right  Arm)   Pulse 70   Temp 98 F (36.7 C) (Oral)   Resp 15   SpO2 100%    Final Clinical Impressions(s) / ED Diagnoses   Final diagnoses:  Paresthesia    ED Discharge Orders    None     11:00 AM Patient with history of atypical migraine here with complaints of left-sided facial droop, and L arm pain with tingling sensation to fingers for the past few days.  Symptom suggestive of complicated migraine.  Initial head CT scan unremarkable, no obvious focal neuro deficit on initial exam.  Labs are reassuring, no electrolytes imbalance.  Creatinine is mildly elevated at 1.38, IV fluid given.  Will consult neurology for recommendation of advanced imaging if indicated.  11:43 AM Appreciate consultation with oncall neurologist Dr. Otelia Limes who agrees with brain MRI w/o CM.  Pt agrees with plan.   2:51 PM MRI of the brain shows no acute intracranial abnormalities.  There is some nonspecific changes which were noted.  I did discuss this with patient and encourage patient to follow-up with his neurologist for further work-up.  Otherwise encourage patient to stay hydrated.  Encourage patient to take his prednisone taper course as well as Valtrex that was previously prescribed for potential Bell's palsy.  Return precautions discussed.  Labs will print  out per patient request.   Fayrene Helper, PA-C 11/23/17 1452    Virgina Norfolk, DO 11/23/17 1901

## 2017-11-23 NOTE — Discharge Instructions (Signed)
Please take your previously prescribed prednisolone along with your valtrex for the full duration.  Your renal function is mildly abnormal.  Drink plenty of fluid and have it recheck in 1 week.  Follow up with your neurologist for further evaluation of your condition.  Return if you have any concerns.

## 2017-11-23 NOTE — ED Notes (Signed)
Pt stable, ambulatory, states understanding of discharge instructions 

## 2017-11-23 NOTE — ED Triage Notes (Signed)
Pt in c/o numbness and "pulling" to the left side of his face and vision changes to his left eye since Saturday, also developed left arm numbness and tingling since yesterday, went to the [place] ED on Monday and was discharged home but patient concerned due to continued symptoms

## 2017-11-23 NOTE — ED Notes (Signed)
Patient transported to MRI 

## 2017-12-13 ENCOUNTER — Ambulatory Visit (INDEPENDENT_AMBULATORY_CARE_PROVIDER_SITE_OTHER): Payer: No Typology Code available for payment source | Admitting: Internal Medicine

## 2017-12-13 ENCOUNTER — Encounter: Payer: Self-pay | Admitting: Internal Medicine

## 2017-12-13 ENCOUNTER — Ambulatory Visit: Payer: Non-veteran care | Admitting: Internal Medicine

## 2017-12-13 VITALS — BP 112/80 | HR 74 | Ht 70.0 in | Wt 170.0 lb

## 2017-12-13 DIAGNOSIS — Z578 Occupational exposure to other risk factors: Secondary | ICD-10-CM

## 2017-12-13 DIAGNOSIS — Z579 Occupational exposure to unspecified risk factor: Secondary | ICD-10-CM | POA: Diagnosis not present

## 2017-12-13 DIAGNOSIS — J4541 Moderate persistent asthma with (acute) exacerbation: Secondary | ICD-10-CM

## 2017-12-13 NOTE — Addendum Note (Signed)
Addended by: Shane Crutch on: 12/13/2017 03:44 PM   Modules accepted: Level of Service

## 2017-12-13 NOTE — Patient Instructions (Addendum)
Use rescue inhaler when needed.

## 2017-12-13 NOTE — Progress Notes (Addendum)
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 -Doing well, currently off of Anruity.  --Continue use albuterol as needed.   Followup as needed.    Date: 12/13/2017  MRN# [id number] Jacob Malone [birth year]    Jacob Malone is a 52 y.o. old male seen in consultation for chief complaint of:    Chief Complaint  Patient presents with  . Asthma    pt states no change since last visit.His breathing is ok because he in not in same work enviroment.    HPI:  Patient is a 52 year old male with a diagnosis of occupational asthma, mainly due to exposure to paint and other renovations going on at work.  At last visit he had been doing better with Arnuity 200, he was therefore stepped down to Arnuity 100 daily.He changed his soap and laundry detergent to a fragrance free variety and found that he had a "night and day difference".  He is not on an antihistamine or Singulair. His breathing does not feel labored, but he does not exercise. He was fired from his job after his FMLA ended.  He is now looking for a new job.  He complains of occasional raspy voice, though audibly voice sounds normal today   Addendum: Pt would like it noted that his sensitivity to soap, and laundry detergent only occurred as a result of asthma which was due to his job. This impacts his life. He would like it noted that the raspy nature of his voice is constant.  -DR.    **chest x-ray 05/22/17>>in comparison with previous 10/06/14; normal lungs.   **CBC 03/01/17>> absolute eosinophils 100.  OV 04/24/17 I had a discussion with Jacob today about his persistent symptoms at work.  He notes continued symptoms, and comes in today because his HR department would like to speak with me in regards to how to reduce his symptoms.  Therefore in the patient's presence and at his request I called the HR department at [phone], and spoke with him via  speaker phone and patient's presence.  HR asked for clarification on what the patient needs to reduce his symptoms.  I instructed them that the patient needs a well ventilated area so that he does not have symptoms, he would also be desirable for the patient to be placed in areas where there is not renovation ongoing, or recently which seems to be the issue which causes his asthma symptoms.  These asthma symptoms can be triggered by fumes, and can persist for weeks, or possibly months.  I explained that I am also now starting the patient on treatment, and I am hopeful that his symptoms will resolve or at least significantly improved.  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    Medication:    Current Outpatient Medications:  .  albuterol (PROAIR HFA) 108 (90 Base) MCG/ACT inhaler, Inhale 1-2 puffs into the lungs every 4 (four) hours as needed for wheezing or shortness of breath., Disp: 1 Inhaler, Rfl: 5 .  AMBULATORY NON FORMULARY MEDICATION, Medication Name: Peak flow meter. Use once every morning and once every afternoon. DX:J45.41, Disp: 1 each, Rfl: 0 .  Fluticasone Furoate (  ARNUITY ELLIPTA) 100 MCG/ACT AEPB, Inhale 1 puff into the lungs daily. (Patient not taking: Reported on 11/23/2017), Disp: 30 each, Rfl: 3 .  lamoTRIgine (LAMICTAL) 100 MG tablet, Take 100 mg by mouth daily., Disp: , Rfl: 1 .  valACYclovir (VALTREX) 1000 MG tablet, Take 1,000 mg by mouth 3 (three) times daily., Disp: , Rfl:    Allergies:  Lansoprazole  Review of Systems:  Constitutional: Feels well. The remainder of systems were reviewed and were found to be negative other than what is documented in the HPI.   Physical Examination:   VS: BP 112/80 (BP Location: Left Arm, Cuff Size:  Normal)   Pulse 74   Ht 5\' 10"  (1.778 m)   Wt 170 lb (77.1 kg)   SpO2 98%   BMI 24.39 kg/m   General Appearance: No distress  Neuro:without focal findings, mental status, speech normal, alert and oriented HEENT: PERRLA, EOM intact Pulmonary: No wheezing, No rales  CardiovascularNormal S1,S2.  No m/r/g.  Endoc: No evident thyromegaly, no signs of acromegaly or Cushing features       LABORATORY PANEL:   CBC No results for input(s): WBC, HGB, HCT, PLT in the last 168 hours. ------------------------------------------------------------------------------------------------------------------  Chemistries  No results for input(s): NA, K, CL, CO2, GLUCOSE, BUN, CREATININE, CALCIUM, MG, AST, ALT, ALKPHOS, BILITOT in the last 168 hours.  Invalid input(s): GFRCGP ------------------------------------------------------------------------------------------------------------------  Cardiac Enzymes No results for input(s): TROPONINI in the last 168 hours. ------------------------------------------------------------  RADIOLOGY:  No results found.     Thank  you for the consultation and for allowing [facility] to assist in the care of your patient. Our recommendations are noted above.  Please contact us if we can be of further service.  Wells Guiles, M.D., F.C.C.P.  Board Certified in Internal Medicine, Pulmonary Medicine, Critical Care Medicine, and Sleep Medicine.  [facility] Office Number: [phone]   12/13/2017

## 2017-12-18 ENCOUNTER — Ambulatory Visit: Payer: Non-veteran care | Admitting: Internal Medicine

## 2018-11-06 ENCOUNTER — Emergency Department (HOSPITAL_COMMUNITY)
Admission: EM | Admit: 2018-11-06 | Discharge: 2018-11-07 | Disposition: A | Discharge status: Home / Self Care | Payer: No Typology Code available for payment source | Attending: Emergency Medicine | Admitting: Emergency Medicine

## 2018-11-06 ENCOUNTER — Emergency Department (HOSPITAL_COMMUNITY): Payer: No Typology Code available for payment source

## 2018-11-06 ENCOUNTER — Other Ambulatory Visit: Payer: Self-pay

## 2018-11-06 DIAGNOSIS — R079 Chest pain, unspecified: Secondary | ICD-10-CM | POA: Insufficient documentation

## 2018-11-06 DIAGNOSIS — E119 Type 2 diabetes mellitus without complications: Secondary | ICD-10-CM | POA: Diagnosis not present

## 2018-11-06 DIAGNOSIS — Z79899 Other long term (current) drug therapy: Secondary | ICD-10-CM | POA: Insufficient documentation

## 2018-11-06 DIAGNOSIS — M79605 Pain in left leg: Secondary | ICD-10-CM | POA: Diagnosis not present

## 2018-11-06 LAB — BASIC METABOLIC PANEL
Anion gap: 10 (ref 5–15)
BUN: 12 mg/dL (ref 6–20)
CO2: 25 mmol/L (ref 22–32)
Calcium: 9.5 mg/dL (ref 8.9–10.3)
Chloride: 102 mmol/L (ref 98–111)
Creatinine, Ser: 1.44 mg/dL — ABNORMAL HIGH (ref 0.61–1.24)
GFR calc Af Amer: >60 mL/min (ref >60)
GFR calc non Af Amer: 56 mL/min — ABNORMAL LOW (ref >60)
Glucose, Bld: 97 mg/dL (ref 70–99)
Potassium: 4 mmol/L (ref 3.5–5.1)
Sodium: 137 mmol/L (ref 135–145)

## 2018-11-06 LAB — CBC
HCT: 40.2 % (ref 39.0–52.0)
Hemoglobin: 13.4 g/dL (ref 13.0–17.0)
MCH: 30.5 pg (ref 26.0–34.0)
MCHC: 33.3 g/dL (ref 30.0–36.0)
MCV: 91.6 fL (ref 80.0–100.0)
Platelets: 217 10*3/uL (ref 150–400)
RBC: 4.39 MIL/uL (ref 4.22–5.81)
RDW: 12.1 % (ref 11.5–15.5)
WBC: 4.1 10*3/uL (ref 4.0–10.5)
nRBC: 0 % (ref 0.0–0.2)

## 2018-11-06 LAB — TROPONIN I (HIGH SENSITIVITY)
Troponin I (High Sensitivity): 5 ng/L (ref <18)
Troponin I (High Sensitivity): <2 ng/L (ref <18)

## 2018-11-06 MED ORDER — SODIUM CHLORIDE 0.9% FLUSH: 3.0000 mL | Freq: Once | INTRAVENOUS | Status: DC

## 2018-11-06 NOTE — ED Provider Notes (Signed)
[facility] EMERGENCY DEPARTMENT Provider Note   CSN: [id number] Arrival date & time: 11/06/18  1640     History   Chief Complaint Chief Complaint  Patient presents with  . Chest Pain    HPI Jacob Malone is a 53 y.o. male who presents to the ED today complaining of gradual onset, intermittent, left-sided chest pain with radiation to his arm began a couple of days ago, worse yesterday.  He reports that he went to the [place] to see his PCP and was told to go to an ED for further evaluation.  He states he went to the [facility] ED and mention his chest pain but states that they dismissed his complaints and sent him home.  He states that they thought his symptoms were related to a pinched nerve.  Reports he has also been having this intermittent "hot sensation" to his left anterior thigh for the past week.  He states that the symptoms last a couple of seconds and then dissipate.  He was concerned that he could have a blood clot in his legs and would like a DVT study.  He states that the [facility] ED provider dismissed this as well.  No history of DVT/PE.  No recent prolonged travel or immobilization. No hemoptysis. No active malignancy.  Denies fever, chills, shortness of breath, diaphoresis, nausea, vomiting, leg swelling, any other associated symptoms.        Past Medical History:  Diagnosis Date  . Chicken pox   . Colon polyp    sessile - lost in stool  . Headache, chronic daily 2013   d/t lumbar puncture   . Hemorrhoids   . Iron deficiency anemia   . Migrainous dizziness 2012    Patient Active Problem List   Diagnosis Date Noted  . Contracture of palmar fascia 02/21/2017  . Pain 02/21/2017  . Primary osteoarthritis of first carpometacarpal joint of right hand 02/21/2017  . Discoloration of skin 01/20/2015  . RLQ abdominal pain 05/06/2014  . 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 Dizziness 10/02/2013  . Adhesive capsulitis of right shoulder 12/18/2011  . Shoulder pain 10/20/2011  . 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 Migraine with vertigo 05/23/2011  . Reflux 05/23/2011  . FOOT PAIN, BILATERAL 08/21/2008    Past Surgical History:  Procedure Laterality Date  . HEMORROIDECTOMY  2011, 2012  . NASAL SEPTUM SURGERY          Home Medications    Prior to Admission medications   Medication Sig Start Date End Date Taking? Authorizing Provider  albuterol (PROAIR HFA) 108 (90 Base) MCG/ACT inhaler Inhale 1-2 puffs into the lungs every 4 (four) hours as needed for wheezing or shortness of breath. 04/11/17   Laverle Hobby, MD  AMBULATORY NON FORMULARY MEDICATION Medication Name: Peak flow meter. Use once every morning and once every afternoon. OZ:D66.44 04/11/17   Laverle Hobby, MD  Fluticasone Furoate (ARNUITY ELLIPTA) 100 MCG/ACT AEPB Inhale 1 puff into the lungs daily. Patient not taking: Reported on 12/13/2017 05/22/17   Laverle Hobby, MD  lamoTRIgine (LAMICTAL) 100 MG tablet Take 100 mg by mouth daily. 04/22/14   [provider]    Family History Family History  Problem Relation Age of Onset  . Alcohol abuse Father   . Diabetes Father   . Cancer Father        Lung  . Cancer  Paternal Grandfather        Abdominal Cancer    Social History Social History   Tobacco Use  . Smoking status: Never Smoker  . Smokeless tobacco: Never Used  Substance Use Topics  . Alcohol use: No  . Drug use: No     Allergies   Lansoprazole   Review of Systems Review of Systems  Constitutional: Negative for chills and fever.  HENT: Negative for congestion.   Eyes: Negative for visual disturbance.  Respiratory: Negative for cough and shortness of breath.   Cardiovascular: Positive for chest pain. Negative for palpitations and leg swelling.  Gastrointestinal: Negative for abdominal pain,  constipation, diarrhea, nausea and vomiting.  Genitourinary: Negative for difficulty urinating, dysuria, flank pain and frequency.  Musculoskeletal: Negative for myalgias.  Skin: Negative for rash.  Neurological: Negative for weakness, numbness and headaches.     Physical Exam Updated Vital Signs BP 131/83 (BP Location: Right Arm)   Pulse 60   Temp 98.1 F (36.7 C) (Oral)   Resp 16   SpO2 99%   Physical Exam Vitals signs and nursing note reviewed.  Constitutional:      Appearance: He is not ill-appearing or diaphoretic.  HENT:     Head: Normocephalic and atraumatic.  Eyes:     Conjunctiva/sclera: Conjunctivae normal.  Neck:     Musculoskeletal: Neck supple.  Cardiovascular:     Rate and Rhythm: Normal rate and regular rhythm.     Pulses:          Radial pulses are 2+ on the right side and 2+ on the left side.       Dorsalis pedis pulses are 2+ on the right side and 2+ on the left side.     Heart sounds: Normal heart sounds.  Pulmonary:     Effort: Pulmonary effort is normal.     Breath sounds: Normal breath sounds. No decreased breath sounds, wheezing, rhonchi or rales.  Chest:     Chest wall: No tenderness.  Abdominal:     Palpations: Abdomen is soft.     Tenderness: There is no abdominal tenderness. There is no guarding or rebound.  Musculoskeletal:     Right lower leg: He exhibits no tenderness. No edema.     Left lower leg: He exhibits no tenderness. No edema.  Skin:    General: Skin is warm and dry.  Neurological:     Mental Status: He is alert.      ED Treatments / Results  Labs (all labs ordered are listed, but only abnormal results are displayed) Labs Reviewed  BASIC METABOLIC PANEL - Abnormal; Notable for the following components:      Result Value   Creatinine, Ser 1.44 (*)    GFR calc non Af Amer 56 (*)    All other components within normal limits  CBC  D-DIMER, QUANTITATIVE (NOT AT [facility])  TROPONIN I (HIGH SENSITIVITY)  TROPONIN I (HIGH  SENSITIVITY)    EKG EKG Interpretation  Date/Time:  Wednesday November 06 2018 17:05:59 EDT Ventricular Rate:  70 PR Interval:  158 QRS Duration: 80 QT Interval:  376 QTC Calculation: 406 R Axis:   82 Text Interpretation:  Normal sinus rhythm Normal ECG No significant change since last tracing Confirmed by Alvira MondaySchlossman, Erin ([id number]) on 11/06/2018 11:59:55 PM   Radiology Dg Chest 2 View  Result Date: 11/06/2018 CLINICAL DATA:  Chest pain for 2 days, initial encounter EXAM: CHEST - 2 VIEW COMPARISON:  05/22/2017 FINDINGS: The heart size and  mediastinal contours are within normal limits. Both lungs are clear. The visualized skeletal structures are unremarkable. IMPRESSION: No active cardiopulmonary disease. Electronically Signed   By: Alcide Clever M.D.   On: 11/06/2018 18:06    Procedures Procedures (including critical care time)  Medications Ordered in ED Medications  sodium chloride flush (NS) 0.9 % injection 3 mL (3 mLs Intravenous Not Given 11/06/18 2243)     Initial Impression / Assessment and Plan / ED Course  I have reviewed the triage vital signs and the nursing notes.  Pertinent labs & imaging results that were available during my care of the patient were reviewed by me and considered in my medical decision making (see chart for details).    53 year old male presents to the ED complaining of chest pain radiating down left arm and a "hot sensation" to his left anterior thigh intermittently for the past 4 to 5 days.  Was seen at another ED yesterday but states they did not work him up prompting him to come to the ED today.  Not currently having any active chest pain.  Denies any shortness of breath. Vital signs stable - pt afebrile without tachycardia or tachypnea.  Labwork was obtained prior to being seen - chest x-ray negative.  EGK without ischemic changes. No leukocytosis or electrolyte abnormalities. Creatinine stable compared to previous. Initial trop of 5 and repeat <  2. Heart score 2.   Had discussed with attending physician Dr. Dalene Seltzer regarding ordering outpatient DVT study given they are not here at this time of night and pt complaining of chest pain will order D dimer at this time to rule out PE. Pt to come back for DVT study in the morning.   12:44 AM At shift change case signed out to Dr. Elesa Massed who will follow D dimer and dispo accordingly. If neg pt can follow up with his PCP in the morning/return for DVT study.   This note was prepared using Dragon voice recognition software and may include unintentional dictation errors due to the inherent limitations of voice recognition software.       Final Clinical Impressions(s) / ED Diagnoses   Final diagnoses:  Nonspecific chest pain    ED Discharge Orders    None       Tanda Rockers, PA-C 11/07/18 0045    Alvira Monday, MD 11/07/18 1240

## 2018-11-06 NOTE — ED Triage Notes (Addendum)
Pt here for evaluation of generalized sharp, intermittent chest pain, with radiation to between shoulder blades and tingling in L arm onset last night while at rest. Pt went to the [place] yesterday, and pt felt like they dismissed his complaints. Nothing makes the pain better or worse. Patient also sts he has a hot sensation in his thigh sometimes and would like a dvt study.

## 2018-11-07 ENCOUNTER — Ambulatory Visit (HOSPITAL_COMMUNITY): Admission: RE | Admit: 2018-11-07 | Payer: Self-pay | Source: Ambulatory Visit

## 2018-11-07 LAB — D-DIMER, QUANTITATIVE (NOT AT ARMC): D-Dimer, Quant: <0.27 ug/mL-FEU (ref 0.00–0.50)

## 2018-11-07 NOTE — ED Provider Notes (Signed)
12:30 AM  Assumed care.  Patient is a 53 year old male who presents to the emergency department with complaints of chest pain and a burning sensation in his left lower extremity.  He has had 2 negative troponins.  D-dimer pending.  Scheduled for venous Doppler in the morning.  2:10 AM  Pt's d-dimer is negative.  Have discussed these results with patient will provide copy of his results for his primary doctor appointment that he has today through telemedicine.  We have ordered a venous Doppler for 8 AM but he states he would like to just talk to his primary doctor about this when he sees them at 930.  I feel this is reasonable.  His symptoms seem atypical for DVT especially given he has a negative d-dimer and he has no calf tenderness, swelling, asymmetry on exam.  Discussed return precautions.  He is comfortable with plan for discharge home.   At this time, I do not feel there is any life-threatening condition present. I have reviewed and discussed all results (EKG, imaging, lab, urine as appropriate) and exam findings with patient/family. I have reviewed nursing notes and appropriate previous records.  I feel the patient is safe to be discharged home without further emergent workup and can continue workup as an outpatient as needed. Discussed usual and customary return precautions. Patient/family verbalize understanding and are comfortable with this plan.  Outpatient follow-up has been provided as needed. All questions have been answered.    Anajah Sterbenz, Delice Bison, DO 11/07/18 0214

## 2019-05-06 ENCOUNTER — Encounter: Payer: Self-pay | Admitting: Internal Medicine

## 2019-05-14 ENCOUNTER — Encounter: Payer: Self-pay | Admitting: Internal Medicine

## 2019-06-04 ENCOUNTER — Ambulatory Visit: Payer: No Typology Code available for payment source | Admitting: Gastroenterology

## 2019-06-25 ENCOUNTER — Other Ambulatory Visit: Payer: Self-pay

## 2019-06-26 ENCOUNTER — Encounter: Payer: Self-pay | Admitting: Internal Medicine

## 2019-06-26 ENCOUNTER — Ambulatory Visit (INDEPENDENT_AMBULATORY_CARE_PROVIDER_SITE_OTHER): Payer: No Typology Code available for payment source | Admitting: Internal Medicine

## 2019-06-26 VITALS — BP 100/62 | HR 84 | Ht 70.0 in | Wt 154.0 lb

## 2019-06-26 DIAGNOSIS — R195 Other fecal abnormalities: Secondary | ICD-10-CM

## 2019-06-26 DIAGNOSIS — K909 Intestinal malabsorption, unspecified: Secondary | ICD-10-CM

## 2019-06-26 DIAGNOSIS — R634 Abnormal weight loss: Secondary | ICD-10-CM | POA: Diagnosis not present

## 2019-06-26 NOTE — Patient Instructions (Signed)
We are ordering a SIBO (small intestinal bacterial overgrowth) glucose test on you. The [facility] will mail the kit to you to complete at home.  I appreciate the opportunity to care for you. Silvano Rusk, MD, [facility]

## 2019-06-26 NOTE — Progress Notes (Signed)
Jacob Malone 54 y.o. [birth year] [id number]  Assessment & Plan:   Encounter Diagnoses  Name Primary?  . Loose stools Yes  . Loss of weight   . Steatorrhea?    Glucose hydrogen breth test is reasonable Will order and send results back to [place] w/ plans to follow-up there  Cc: [facility]    Subjective:   Chief Complaint: GERD, loose stools, weight loss  HPI 54 yo single wm [place] veteran referred by [facility] to get a SIBO breath test.  GI notes indicate orange greasy stools consistent with steatorrhea x 1 year.   NL tTG, fecal elastase, calprotectin Folate high so GI MD ? SIBO  After some delay and reticence he had EGD and colonoscopy w/ Nl bxs  Patient says problems now are weight oss and "GERD" ever since EGD 15 # decreas reported w/ diminished appetite and new heartburn not responsive to hi dose H2 B (Pepcid) and sucralfate. Not sure why no PPI but think due to prior unclear side effects from prevacid.  He has been to [facility] ED several x w/ post-EGD c/o and was told "you have to really treat this GERD hard" after c/o hoarseness and regurgitation.  Referred to see NET - pending  "Eats clean and takes kefir daily"  Wt Readings from Last 3 Encounters:  06/26/19 154 lb (69.9 kg)  12/13/17 170 lb (77.1 kg)  11/23/17 169 lb 15.6 oz (77.1 kg)      Allergies  Allergen Reactions  . Lansoprazole Other (See Comments)    "Did not feel right"   Current Meds  Medication Sig  . Cholecalciferol 50 MCG (2000 UT) TABS Take 3 tablets by mouth daily.  . famotidine (PEPCID) 20 MG tablet Take 20 mg by mouth 2 (two) times daily.  Marland Kitchen lamoTRIgine (LAMICTAL) 100 MG tablet Take 100 mg by mouth daily.  . sucralfate (CARAFATE) 1 g tablet Take 1 g by mouth 4 (four) times daily -  with meals and at bedtime. Dissolving in water   Past Medical History:  Diagnosis Date  . Chicken pox   . Colon polyp    sessile - lost in stool  . Headache, chronic daily 2013   d/t lumbar puncture   .  Hemorrhoids   . Iron deficiency anemia   . Migrainous dizziness 2012   Past Surgical History:  Procedure Laterality Date  . COLONOSCOPY    . HEMORROIDECTOMY  2011, 2012  . 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   Review of Systems Tinnitus, headaches (worse since EGD), myalgai O/w neg  Objective:   Physical Exam @BP  100/62   Pulse 84   Ht 5\' 10"  (1.778 m)   Wt 154 lb (69.9 kg)   SpO2 98%   BMI 22.10 kg/m @  General:  Well-developed, well-nourished and in no acute distress Eyes:  anicteric. ENT:   Mouth and posterior pharynx free of lesions.  Neck:   supple w/o thyromegaly or mass.  Lungs: Clear to auscultation bilaterally. Heart:  S1S2, no rubs, murmurs, gallops. Abdomen:  soft, non-tender, no hepatosplenomegaly, hernia, or mass and BS+.  Lymph:  no cervical or supraclavicular adenopathy. Extremities:   no edema, cyanosis or clubbing Skin   no rash. Neuro:  A&O x 3.  Psych:  appropriate mood and  Affect.   Data Reviewed: See HPI

## 2019-06-27 ENCOUNTER — Encounter: Payer: Self-pay | Admitting: Internal Medicine

## 2019-08-27 ENCOUNTER — Telehealth: Payer: Self-pay | Admitting: Internal Medicine

## 2019-08-27 NOTE — Telephone Encounter (Signed)
Glucose hydrogen breath test is negative - I.e. normal.  Does not have the [facility] problem [facility].  Please mail patient a copy and also send one to the [facility] clinic

## 2019-08-27 NOTE — Telephone Encounter (Signed)
Patient results mailed and faxed to the [place]. message left for the patient results mailed to the patient

## 2019-08-28 ENCOUNTER — Telehealth: Payer: Self-pay | Admitting: Internal Medicine

## 2019-08-28 NOTE — Telephone Encounter (Signed)
Dr. Hal Hope from [facility] called and wanted to give you some more information pertaining to report he sent you please call him at   [phone]

## 2019-08-28 NOTE — Telephone Encounter (Signed)
wanted me to know that if the patient did not drink the glucose that could have made the values very low so suggested we double check with the patient to be sure he did drink the glucose substrate  Please do so  Thx

## 2019-09-01 NOTE — Telephone Encounter (Signed)
Patient verified that he fully followed the instructions for the breath test and did drink the glucose drink that was provided.
